# Patient Record
Sex: Male | Born: 1940 | Race: White | Hispanic: No | Smoking: Never smoker
Health system: Southern US, Community
[De-identification: ages and names within clinical notes are randomized; demographics above are authoritative.]

## PROBLEM LIST (undated history)

## (undated) ENCOUNTER — Emergency Department (HOSPITAL_COMMUNITY)
Discharge status: Against Medical Advice | Payer: Medicare Other | Attending: Emergency Medicine | Admitting: Emergency Medicine

## (undated) DIAGNOSIS — N400 Enlarged prostate without lower urinary tract symptoms: Secondary | ICD-10-CM

## (undated) DIAGNOSIS — Z87442 Personal history of urinary calculi: Secondary | ICD-10-CM

## (undated) DIAGNOSIS — F419 Anxiety disorder, unspecified: Secondary | ICD-10-CM

## (undated) DIAGNOSIS — I10 Essential (primary) hypertension: Secondary | ICD-10-CM

## (undated) DIAGNOSIS — E785 Hyperlipidemia, unspecified: Secondary | ICD-10-CM

## (undated) DIAGNOSIS — F0390 Unspecified dementia without behavioral disturbance: Secondary | ICD-10-CM

## (undated) HISTORY — PX: BACK SURGERY: SHX140

## (undated) HISTORY — DX: Hyperlipidemia, unspecified: E78.5

## (undated) HISTORY — PX: APPENDECTOMY: SHX54

## (undated) HISTORY — PX: TONSILLECTOMY: SHX5217

## (undated) HISTORY — DX: Essential (primary) hypertension: I10

## (undated) HISTORY — PX: CYSTOSCOPY W/ RETROGRADES: SHX1426

---

## 2001-02-13 ENCOUNTER — Encounter: Admission: RE | Admit: 2001-02-13 | Discharge: 2001-02-13 | Payer: Self-pay | Admitting: Sports Medicine

## 2001-12-07 ENCOUNTER — Ambulatory Visit (HOSPITAL_COMMUNITY): Admission: RE | Admit: 2001-12-07 | Discharge: 2001-12-07 | Payer: Self-pay | Admitting: Family Medicine

## 2001-12-07 ENCOUNTER — Encounter: Payer: Self-pay | Admitting: Family Medicine

## 2003-05-09 ENCOUNTER — Ambulatory Visit (HOSPITAL_COMMUNITY): Admission: RE | Admit: 2003-05-09 | Discharge: 2003-05-09 | Payer: Self-pay | Admitting: Internal Medicine

## 2005-03-05 ENCOUNTER — Ambulatory Visit: Payer: Self-pay | Admitting: Cardiology

## 2005-05-19 ENCOUNTER — Ambulatory Visit: Payer: Self-pay | Admitting: Cardiology

## 2005-07-27 ENCOUNTER — Ambulatory Visit: Payer: Self-pay | Admitting: Cardiology

## 2006-08-15 ENCOUNTER — Ambulatory Visit: Payer: Self-pay | Admitting: Cardiology

## 2007-08-01 ENCOUNTER — Ambulatory Visit: Payer: Self-pay | Admitting: Cardiology

## 2007-09-18 ENCOUNTER — Ambulatory Visit (HOSPITAL_COMMUNITY): Admission: RE | Admit: 2007-09-18 | Discharge: 2007-09-18 | Payer: Self-pay | Admitting: Urology

## 2007-10-03 ENCOUNTER — Encounter (HOSPITAL_COMMUNITY): Admission: RE | Admit: 2007-10-03 | Discharge: 2007-10-25 | Payer: Self-pay | Admitting: Orthopaedic Surgery

## 2007-10-09 ENCOUNTER — Ambulatory Visit (HOSPITAL_COMMUNITY): Admission: RE | Admit: 2007-10-09 | Discharge: 2007-10-09 | Payer: Self-pay | Admitting: Urology

## 2007-11-01 ENCOUNTER — Ambulatory Visit: Admission: RE | Admit: 2007-11-01 | Discharge: 2007-11-01 | Payer: Self-pay | Admitting: Orthopaedic Surgery

## 2007-12-28 ENCOUNTER — Ambulatory Visit (HOSPITAL_COMMUNITY): Admission: RE | Admit: 2007-12-28 | Discharge: 2007-12-28 | Payer: Self-pay | Admitting: Urology

## 2008-04-10 ENCOUNTER — Ambulatory Visit (HOSPITAL_BASED_OUTPATIENT_CLINIC_OR_DEPARTMENT_OTHER): Admission: RE | Admit: 2008-04-10 | Discharge: 2008-04-10 | Payer: Self-pay | Admitting: Urology

## 2008-05-29 ENCOUNTER — Encounter (HOSPITAL_COMMUNITY): Admission: RE | Admit: 2008-05-29 | Discharge: 2008-06-28 | Payer: Self-pay | Admitting: Orthopedic Surgery

## 2008-06-18 ENCOUNTER — Emergency Department (HOSPITAL_COMMUNITY): Admission: EM | Admit: 2008-06-18 | Discharge: 2008-06-18 | Payer: Self-pay | Admitting: Emergency Medicine

## 2008-07-03 ENCOUNTER — Encounter (HOSPITAL_COMMUNITY): Admission: RE | Admit: 2008-07-03 | Discharge: 2008-07-22 | Payer: Self-pay | Admitting: Orthopedic Surgery

## 2008-08-02 ENCOUNTER — Ambulatory Visit: Payer: Self-pay | Admitting: Cardiology

## 2009-02-12 ENCOUNTER — Ambulatory Visit (HOSPITAL_COMMUNITY): Admission: RE | Admit: 2009-02-12 | Discharge: 2009-02-12 | Payer: Self-pay | Admitting: Family Medicine

## 2009-02-26 ENCOUNTER — Ambulatory Visit (HOSPITAL_COMMUNITY): Admission: RE | Admit: 2009-02-26 | Discharge: 2009-02-26 | Payer: Self-pay | Admitting: Family Medicine

## 2009-06-05 ENCOUNTER — Encounter (INDEPENDENT_AMBULATORY_CARE_PROVIDER_SITE_OTHER): Payer: Self-pay | Admitting: *Deleted

## 2009-07-02 ENCOUNTER — Encounter (INDEPENDENT_AMBULATORY_CARE_PROVIDER_SITE_OTHER): Payer: Self-pay | Admitting: *Deleted

## 2009-07-21 ENCOUNTER — Ambulatory Visit (HOSPITAL_COMMUNITY): Admission: RE | Admit: 2009-07-21 | Discharge: 2009-07-21 | Payer: Self-pay | Admitting: Urology

## 2009-08-01 DIAGNOSIS — E785 Hyperlipidemia, unspecified: Secondary | ICD-10-CM | POA: Insufficient documentation

## 2009-08-01 DIAGNOSIS — I10 Essential (primary) hypertension: Secondary | ICD-10-CM | POA: Insufficient documentation

## 2009-08-13 ENCOUNTER — Encounter (INDEPENDENT_AMBULATORY_CARE_PROVIDER_SITE_OTHER): Payer: Self-pay | Admitting: *Deleted

## 2009-08-13 ENCOUNTER — Ambulatory Visit: Payer: Self-pay | Admitting: Cardiology

## 2009-08-14 ENCOUNTER — Encounter: Payer: Self-pay | Admitting: Cardiology

## 2009-08-22 ENCOUNTER — Encounter (INDEPENDENT_AMBULATORY_CARE_PROVIDER_SITE_OTHER): Payer: Self-pay | Admitting: *Deleted

## 2009-10-27 ENCOUNTER — Ambulatory Visit (HOSPITAL_COMMUNITY): Admission: RE | Admit: 2009-10-27 | Discharge: 2009-10-27 | Payer: Self-pay | Admitting: Urology

## 2010-01-01 ENCOUNTER — Encounter: Payer: Self-pay | Admitting: Cardiology

## 2010-08-13 ENCOUNTER — Ambulatory Visit: Payer: Self-pay | Admitting: Cardiology

## 2010-11-10 ENCOUNTER — Ambulatory Visit
Admission: RE | Admit: 2010-11-10 | Discharge: 2010-11-10 | Payer: Self-pay | Source: Home / Self Care | Attending: Urology | Admitting: Urology

## 2010-11-11 LAB — BASIC METABOLIC PANEL
BUN: 21 mg/dL (ref 6–23)
CO2: 29 mEq/L (ref 19–32)
Calcium: 9.5 mg/dL (ref 8.4–10.5)
Chloride: 103 mEq/L (ref 96–112)
Creatinine, Ser: 1.11 mg/dL (ref 0.4–1.5)
GFR calc Af Amer: >60 mL/min (ref >60)
GFR calc non Af Amer: >60 mL/min (ref >60)
Glucose, Bld: 107 mg/dL — ABNORMAL HIGH (ref 70–99)
Potassium: 3.9 mEq/L (ref 3.5–5.1)
Sodium: 138 mEq/L (ref 135–145)

## 2010-11-11 LAB — CBC
HCT: 37.9 % — ABNORMAL LOW (ref 39.0–52.0)
Hemoglobin: 13 g/dL (ref 13.0–17.0)
MCH: 32.3 pg (ref 26.0–34.0)
MCHC: 34.3 g/dL (ref 30.0–36.0)
MCV: 94.3 fL (ref 78.0–100.0)
Platelets: 190 10*3/uL (ref 150–400)
RBC: 4.02 MIL/uL — ABNORMAL LOW (ref 4.22–5.81)
RDW: 12.5 % (ref 11.5–15.5)
WBC: 6 10*3/uL (ref 4.0–10.5)

## 2010-11-19 ENCOUNTER — Ambulatory Visit (HOSPITAL_COMMUNITY)
Admission: RE | Admit: 2010-11-19 | Discharge: 2010-11-19 | Payer: Self-pay | Source: Home / Self Care | Attending: Urology | Admitting: Urology

## 2010-11-26 NOTE — Assessment & Plan Note (Signed)
Summary: f1y per check out/lg   Visit Type:  1 yr f/u Primary Provider:  John Giovanni  CC:  no cardiac complaints today.  History of Present Illness: Mr Miron times a day for further management and evaluation of his hyperlipidemia hypertension.  He's having no symptoms of angina or ischemia. He is extremely health-conscious and is very compliant. His weight is stable. He takes all his meds. His lipids have been a goal as has his blood pressure.  Current Medications (verified): 1)  Simvastatin 20 Mg Tabs (Simvastatin) .Marland Kitchen.. 1 Tab Once Daily 2)  Flomax 0.4 Mg Caps (Tamsulosin Hcl) .Marland Kitchen.. 1 Cap Once Daily 3)  Zyrtec Allergy 10 Mg Tabs (Cetirizine Hcl) .Marland Kitchen.. 1 Tab Once Daily 4)  Flonase 50 Mcg/act Susp (Fluticasone Propionate) .... Use As Directed 5)  Multivitamins   Tabs (Multiple Vitamin) .Marland Kitchen.. 1 Tab Once Daily 6)  Aspirin Ec 325 Mg Tbec (Aspirin) .... Take One Tablet By Mouth Daily 7)  Fish Oil 900 .... 2 Cap Once Daily 8)  Norvasc 2.5 Mg Tabs (Amlodipine Besylate) .Marland Kitchen.. 1 Tab Once Daily 9)  Proscar 5 Mg Tabs (Finasteride) .Marland Kitchen.. 1 Tab Once Daily 10)  Lisinopril 20 Mg Tabs (Lisinopril) .Marland Kitchen.. 1 Tab Once Daily 11)  K-Phos-Neutral [phone] Mg Tabs (K Phos Di & Mono-Sod Phos Mono) .Marland Kitchen.. 1 Tab Once Daily 12)  Hydroxyzine Hcl 10 Mg Tabs (Hydroxyzine Hcl) .... As Needed 13)  Triamcinolone Acetonide 0.025 % Crea (Triamcinolone Acetonide) .... As Needed  Allergies (verified): No Known Drug Allergies  Past History:  Past Medical History: Last updated: 2009/08/13 HYPERLIPIDEMIA (ICD-272.4) HYPERTENSION (ICD-401.9)    Past Surgical History: Last updated: 2009/08/13 Cystoscopy, retrograde pyelogram Screening colonoscopy (with biopsy) L5-L6 back surgery Appendectomy Tonsillectomy  Family History: Last updated: 2009-08-13 Father: deceased at 53 due to perforated intestine Mother: alive..has dementia Siblings: brother has diabetes.Marland Kitchenalive               sister deceased at 4 had  diabetes Family History of Coronary Artery Disease:  Family History of Diabetes:  Family History of Hypertension:   Social History: Last updated: 13-Aug-2009 Full Time  Married  Tobacco Use - No.  Alcohol Use - no Regular Exercise - yes  Risk Factors: Exercise: yes (Aug 13, 2009)  Risk Factors: Smoking Status: never (2009/08/13)  Review of Systems       negative other than history of present illness  Vital Signs:  Patient profile:   70 year old male Height:      70 inches Weight:      144 pounds BMI:     20.74 Pulse rate:   60 / minute Pulse rhythm:   irregular BP sitting:   138 / 66  (left arm) Cuff size:   large  Vitals Entered By: Danielle Rankin, CMA (August 13, 2010 9:30 AM)  Physical Exam  General:  Well developed, well nourished, in no acute distress. Head:  normocephalic and atraumatic Eyes:  PERRLA/EOM intact; conjunctiva and lids normal. Neck:  Neck supple, no JVD. No masses, thyromegaly or abnormal cervical nodes. Chest Wall:  no deformities or breast masses noted Lungs:  Clear bilaterally to auscultation and percussion. Heart:  PMI not displaced, normal S1-S2, no murmur, regular rate and rhythm, carotid upstrokes equal bilaterally without bruits Abdomen:  soft, positive bowel sounds, no bruits Msk:  Back normal, normal gait. Muscle strength and tone normal. Pulses:  pulses normal in all 4 extremities Extremities:  No clubbing or cyanosis. Neurologic:  Alert and oriented x 3. Skin:  Intact  without lesions or rashes. Psych:  Normal affect.   Impression & Recommendations:  Problem # 1:  HYPERLIPIDEMIA (ICD-272.4) Assessment Improved  His updated medication list for this problem includes:    Simvastatin 20 Mg Tabs (Simvastatin) .Marland Kitchen... 1 tab once daily  Problem # 2:  HYPERTENSION (ICD-401.9) Assessment: Improved  The following medications were removed from the medication list:    Hydrochlorothiazide 12.5 Mg Caps (Hydrochlorothiazide) .Marland Kitchen... Pt  d/c'd His updated medication list for this problem includes:    Aspirin Ec 325 Mg Tbec (Aspirin) .Marland Kitchen... Take one tablet by mouth daily    Norvasc 2.5 Mg Tabs (Amlodipine besylate) .Marland Kitchen... 1 tab once daily    Lisinopril 20 Mg Tabs (Lisinopril) .Marland Kitchen... 1 tab once daily  Clinical Reports Reviewed:  CXR:  02/12/2009: CXR Results:   Clinical Data: 70 year old male with exposure brain.  Nonsmoker.    CHEST - 2 VIEW    Comparison: CT abdomen 06/18/2008.    Findings: Subcentimeter round densities about the left hilum and at   the right lung base are thought to reflect vessels on end.  No   corresponding granulomas are seen on the comparison abdomen CT.   Cardiac size and mediastinal contours are within normal limits.   Visualized tracheal air column is within normal limits.   Postoperative changes to the distal right clavicle.  No   pneumothorax, pulmonary edema, pleural effusion, consolidation,   confluent airspace opacity, or pulmonary mass identified. No acute   osseous abnormality identified.    IMPRESSION:   No acute cardiopulmonary abnormality.    Read By:  Augusto Gamble,  M.D.   Released By:  Augusto Gamble,  M.D.  Nuclear Study:  05/19/2005:  Assessment: 1. Exaggerated systolic blood pressure response to exercise. 2. Excellent exercise tolerance.  Artavious C. Wall. MD, [facility]  03/20/2004:  Impression: Stress Cardiolite clinically and electrically negative for ischemia. Cardiolite scan with probable normal perfusion and mild soft-tissue attenuation. Note scan is unchanged from still frames from 2002. Left ventricular function on gating calculated at 62%.  Pricilla Riffle, MD, [facility]   Patient Instructions: 1)  Your physician recommends that you schedule a follow-up appointment in: 1 year

## 2010-11-26 NOTE — Op Note (Signed)
NAMESHEFFIELD, HAWKER                 ACCOUNT NO.:  [id number]  MEDICAL RECORD NO.:  [id number]          PATIENT TYPE:  AMB  LOCATION:  DAY                          FACILITY:  [facility]  PHYSICIAN:  Courtney Paris, M.D.DATE OF BIRTH:  [birth year]  DATE OF PROCEDURE:  11/10/2010 DATE OF DISCHARGE:                              OPERATIVE REPORT   PREOPERATIVE DIAGNOSES:  Left distal ureteral stone, right renal stones with horseshoe kidney.  POSTOPERATIVE DIAGNOSES:  No left ureteral stone, right renal stones with horseshoe kidney.  PROCEDURES:  Cystoscopy, bilateral retrogrades with fluoroscopy, left ureteroscopy, right ureteral stent placement.  ANESTHESIA:  General.  SURGEON:  Courtney Paris, M.D.  BRIEF HISTORY:  A 70 year old patient who presented with acute left flank pain on October 07, 2010, and a CT scan showed a left distal ureteral stone that was 8 to 9 mm.  He has had rare but intermittent pain on the left side since then.  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  He had previous right lithotripsy in 2008, September 2010 and July 2011.  He enters to have the stone on the left removed by ureteroscopy, possible holmium laser treatment and placement of a stent on the right with the horseshoe kidney prior to anticipated lithotripsy in about 7 to 10 days.  PROCEDURE IN DETAIL:  The patient was placed on the operating table in the dorsal lithotomy position.  After satisfactory induction of general anesthesia he was prepped and draped with Betadine in the usual sterile fashion.  He was given IV Cipro.  Time-out was then performed and the patient and the procedure were then reconfirmed.  A 21-panendoscope was passed down the anterior urethra.  No strictures were seen.  The posterior urethra was mildly enlarged with his prostate and the bladder was entered.  The right orifice was  easy to see.  The left was a little bit higher placed on the left lateral wall but the bladder was free of any mucosal lesions.  A 6-open-ended ureteral catheter was inserted and the retrograde demonstrated no hydronephrosis but it looked like there was a stone in the left distal ureter.  I passed a guidewire up the left ureteral catheter under fluoroscopy and removed the open-ended catheter. A 6-short ureteroscope was then passed up the ureter but the stone was not seen.  Further view showed that this was probably a phlebolith that was directly posterior to the ureter and was not within the ureter proper.  The scope and guidewire were then removed.  The right orifice was catheterized with the 6 open-ended ureteral catheter and a retrograde demonstrated the horseshoe kidney with the stones in the renal pelvis.  A guidewire was then passed up to the level of the renal pelvis on the right and the open-ended catheter removed. Over the guidewire a 6-French x 24 cm length double-J ureteral stent was then passed and the guidewire was removed with a nice coil in the renal pelvis and one in the bladder.  The bladder was drained and the scope  removed.  The patient was taken to the recovery room in good condition to be later treated with lithotripsy on the right in 9 days.     Courtney Paris, M.D.     HMK/MEDQ  D:  11/10/2010  T:  11/10/2010  Job:  [id number]  Electronically Signed by Vic Blackbird M.D. on 11/26/2010 01:24:48 PM

## 2011-01-10 LAB — BASIC METABOLIC PANEL
BUN: 17 mg/dL (ref 6–23)
CO2: 29 mEq/L (ref 19–32)
Calcium: 9.2 mg/dL (ref 8.4–10.5)
Chloride: 106 mEq/L (ref 96–112)
Creatinine, Ser: 1.01 mg/dL (ref 0.4–1.5)
GFR calc Af Amer: >60 mL/min (ref >60)
GFR calc non Af Amer: >60 mL/min (ref >60)
Glucose, Bld: 102 mg/dL — ABNORMAL HIGH (ref 70–99)
Potassium: 4.3 mEq/L (ref 3.5–5.1)
Sodium: 139 mEq/L (ref 135–145)

## 2011-01-10 LAB — CBC
HCT: 39.3 % (ref 39.0–52.0)
Hemoglobin: 13.2 g/dL (ref 13.0–17.0)
MCHC: 33.5 g/dL (ref 30.0–36.0)
MCV: 96 fL (ref 78.0–100.0)
Platelets: 182 10*3/uL (ref 150–400)
RBC: 4.09 MIL/uL — ABNORMAL LOW (ref 4.22–5.81)
RDW: 12.9 % (ref 11.5–15.5)
WBC: 5 10*3/uL (ref 4.0–10.5)

## 2011-01-11 ENCOUNTER — Other Ambulatory Visit (HOSPITAL_COMMUNITY): Payer: Self-pay | Admitting: Family Medicine

## 2011-01-11 DIAGNOSIS — Z139 Encounter for screening, unspecified: Secondary | ICD-10-CM

## 2011-01-13 ENCOUNTER — Ambulatory Visit (HOSPITAL_COMMUNITY)
Admission: RE | Admit: 2011-01-13 | Discharge: 2011-01-13 | Disposition: A | Discharge status: Home / Self Care | Payer: Medicare Other | Source: Ambulatory Visit | Attending: Family Medicine | Admitting: Family Medicine

## 2011-01-13 DIAGNOSIS — Z139 Encounter for screening, unspecified: Secondary | ICD-10-CM

## 2011-01-13 DIAGNOSIS — M899 Disorder of bone, unspecified: Secondary | ICD-10-CM | POA: Insufficient documentation

## 2011-01-29 LAB — CBC
HCT: 40.4 % (ref 39.0–52.0)
Hemoglobin: 14 g/dL (ref 13.0–17.0)
MCHC: 34.6 g/dL (ref 30.0–36.0)
MCV: 96.2 fL (ref 78.0–100.0)
Platelets: 201 10*3/uL (ref 150–400)
RBC: 4.2 MIL/uL — ABNORMAL LOW (ref 4.22–5.81)
RDW: 13.1 % (ref 11.5–15.5)
WBC: 5.9 10*3/uL (ref 4.0–10.5)

## 2011-01-29 LAB — BASIC METABOLIC PANEL
BUN: 16 mg/dL (ref 6–23)
CO2: 29 mEq/L (ref 19–32)
Calcium: 9.5 mg/dL (ref 8.4–10.5)
Chloride: 106 mEq/L (ref 96–112)
Creatinine, Ser: 1.13 mg/dL (ref 0.4–1.5)
GFR calc Af Amer: >60 mL/min (ref >60)
GFR calc non Af Amer: >60 mL/min (ref >60)
Glucose, Bld: 98 mg/dL (ref 70–99)
Potassium: 5.1 mEq/L (ref 3.5–5.1)
Sodium: 140 mEq/L (ref 135–145)

## 2011-03-09 NOTE — Assessment & Plan Note (Signed)
[facility]                            CARDIOLOGY OFFICE NOTE   Jacob Fleming, Jacob Fleming                        MRN:          [id number]  DATE:08/01/2007                            DOB:          1941/01/22    The patient returns to the office today.   PROBLEM LIST:  1. Hypertension which has been well controlled.  2. Hyperlipidemia.  Lipids were at goal when Dr. Sudie Bailey checked them      on December 30, 2006.   CURRENT MEDICATIONS:  1. Zocor 20 mg a day.  2. Aspirin 325 mg a day.  3. Hydrochlorothiazide 12.5 mg a day.  4. Omega-3.  5. Norvasc 2.5 mg a day.   [place]  10 days.  He had no symptoms of angina, ischemia, or otherwise.   PHYSICAL EXAMINATION:  VITAL SIGNS:  Blood pressure 132/70, pulse 60 and  regular, weight 153.  GENERAL:  He looks 10 years younger than his stated age.  He is alert  and oriented x3.  SKIN:  Warm and dry.  NECK:  Supple.  Carotid upstrokes are equal bilaterally without bruits.  No JVD.  Thyroid is not enlarged.  Trachea is midline.  LUNGS:  Clear.  HEART:  Nondisplaced PMI and normal S1 and S2.  ABDOMEN:  Soft with good bowel sounds.  EXTREMITIES:  No edema.  Pulses are brisk.  NEUROLOGY:  Intact.  MUSCULOSKELETAL:  Unremarkable.  SKIN:  Normal.   EKG shows sinus rhythm with a mild left axis deviation with poor R wave  progression in the anterior precordial leads.  This is unchanged.   LABORATORY DATA:  I reviewed the laboratory data from Dr. Michelle Nasuti  office with the patient.  Specifically his hemoglobin A1C was 6.0,  lipids are at goal, hemoglobin 12.8.  He looked a little bit volume  depleted with a BUN of 25, creatinine 1.15.  PSA was normal at 0.17.   PLAN:  The patient is doing well.  I have no changes in his program.  I  have advised him to drink more free water.  I will see him back in a  year.     Andrew C. Daleen Squibb, MD, [facility]  Electronically Signed    TCW/MedQ  DD: 08/01/2007  DT: 08/02/2007  Job #: [id number]   cc:   Mila Homer. Sudie Bailey, M.D.

## 2011-03-09 NOTE — Op Note (Signed)
Jacob Fleming, Jacob Fleming                 ACCOUNT NO.:  [id number]   MEDICAL RECORD NO.:  [id number]          PATIENT TYPE:  AMB   LOCATION:  [facility]                         FACILITY:  [facility]   PHYSICIAN:  Courtney Paris, M.D.DATE OF BIRTH:  [birth year]   DATE OF PROCEDURE:  04/10/2008  DATE OF DISCHARGE:                               OPERATIVE REPORT   PREOPERATIVE DIAGNOSIS:  Right ureteropelvic junction stone, horseshoe  kidney.   POSTOPERATIVE DIAGNOSIS:  Right ureteropelvic junction stone, horseshoe  kidney.   OPERATIONS:  Cystoscopy, retrograde pyelogram, flexible ureteroscopy  with holmium lase tripsy and insertion of right ureteral stent.   ANESTHESIA:  General.   SURGEON:  Courtney Paris, M.D.   BRIEF HISTORY:  This 70 year old patient was admitted for a persistent  10-12 mm stone at the proximal right ureter of a horseshoe kidney.  This  has been refractory to lithotripsy x2 both in November 2008 and March  2009.  He enters now for ureteroscopy and lasertripsy at this time.  He  is having increasing pain and he does have hydronephrosis on the right  side.   The patient was placed on the operating table in dorsal lithotomy  position.  After satisfactory induction of general anesthesia, he was  prepped and draped with the usual sterile fashion.  Time-out was then  performed and the patient and the procedure were then reconfirmed.  He  was getting Cipro IV.  The panendoscope was passed down the anterior  urethra.  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  The  posterior urethra with mildly enlarged prostate.  The bladder was  entered, carefully inspected.  No lesions seen.  The right orifice was  visualized and a 5 open-ended ureteral catheter was then inserted.   An occlusive retrograde demonstrated the stone in the proximal right  ureter and what appeared to be a UPJ obstruction as it  took a lot of  pressure to get the dye to go past the stone into the dilated collecting  system.   A guidewire was then passed beyond the stone into the kidney.  The scope  was then removed and over the guidewire a short ureteral access sheath  was then passed up to the level of the stone and then again with the  ureteral access sheath over the outside.  When this was done and the  ureteral access sheath had been inserted up to the level of the stone,  the inner cannula and the guidewire were then removed.  Using the [employer] flexible ureteroscope, I passed this up the access sheath to the  stone, took some pictures of the original stone impacted at the UV  junction and then used the holmium laser at 0.5 joules under direct  vision and broke the stone into pieces.  There were some pieces that  were very adherent to the mucosa and it took quite a bit of manipulation  to tease these off the mucosa.  Finally all the stone was completely  removed.  This was confirmed by x-ray and also visually.  A small  fragment was removed,the rest of them flushed out into the bladder  through the scope.  When I finished after about nearly an hour and a  half of working, the scope would not come out through the access sheath  so I had to remove the access sheath with the scope intact.  I then  passed the cystoscope again and a guidewire was then passed into the  orifice.  There was a little bit of blood coming from the right orifice  but the guidewire passed up into the region of the kidney without  difficulty and a 6-French x 24 cm length double-J ureteral stent was  then passed.  The guidewire was  removed with a nice coil in the renal pelvis and one in the bladder.  Bladder was drained, B and O suppository inserted.  The patient given  Toradol IV.  He was taken to the recovery room in good condition and  will be later discharged as an outpatient.  We will leave the stent  probably for 10 to 12  days to allow healing of the UP junction.      Courtney Paris, M.D.  Electronically Signed     HMK/MEDQ  D:  04/10/2008  T:  04/10/2008  Job:  [id number]
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
## 2011-03-12 NOTE — Op Note (Signed)
NAME:  DOCTOR, SHEAHAN                           ACCOUNT NO.:  [id number]   MEDICAL RECORD NO.:  [id number]                   PATIENT TYPE:  AMB   LOCATION:  DAY                                  FACILITY:  [facility]   PHYSICIAN:  R. Roetta Sessions, M.D.              DATE OF BIRTH:  1940/11/29   DATE OF PROCEDURE:  05/09/2003  DATE OF DISCHARGE:                                  PROCEDURE NOTE   PROCEDURE:  Screening colonoscopy (with biopsy).   ENDOSCOPIST:  Gerrit Friends. Rourk, M.D.   INDICATIONS FOR PROCEDURE:  The patient is a 70 year old gentleman referred  for colorectal cancer screening at the courtesy of Dr. John Giovanni.  He  has a history of having a sigmoidoscopy previously.  He has never had his  entire lower GI tract imaged.  No family history of colorectal neoplasia.  He has reported no GI tract symptoms.  Colonoscopy is now being done as a  standard screening maneuver.  This approach has been discussed with the  patient, at length, at the bedside. The potential risks, benefits, and  alternatives have been reviewed; questions answered; he is agreeable.  Please see my handwritten H&P for more information.   PROCEDURE NOTE:  O2 saturation, blood pressure, pulse and respirations were  monitored throughout the entire procedure.  Conscious sedation: Versed 3 mg IV, Demerol 75 mg IV in divided doses.   INSTRUMENT:  Olympus video chip pediatric colonoscope.   FINDINGS:  A digital rectal exam revealed no abnormalities.   ENDOSCOPIC FINDINGS:  The prep was excellent.   RECTUM:  Examination of the rectal mucosa including the retroflex view of  the anal verge revealed no abnormalities.   COLON:  The colonic mucosa was surveyed from the rectosigmoid junction  through the left transverse and right colon to the area of the appendiceal  orifice, ileocecal valve, and cecum.  These structures were well seen and  photographed for the record.  The patient was noted to have a 4 mm  sessile  polyp in the mid right colon.  Otherwise, the colonic mucosa all the way to  the cecum appeared normal.   From the level of the cecum and ileocecal valve the scope was slowly  withdrawn. All previously mentioned mucosal surfaces were again seen; and no  abnormalities were observed.  The patient tolerated the procedure well and  was reacted in endoscopy.   IMPRESSION:  1. Normal rectum.  2. Diminutive polyp in the right colon, as described above cold     biopsied/removed.  The remainder of the colonic mucosa appeared normal.   RECOMMENDATIONS:  1. Follow up on path.  2. Further recommendations to follow.  Jonathon Bellows, M.D.    RMR/MEDQ  D:  05/09/2003  T:  05/09/2003  Job:  [id number]   cc:   Mila Homer. Sudie Bailey, M.D.  [address] [place], [place] [address]  Fax: [phone]

## 2011-03-12 NOTE — Assessment & Plan Note (Signed)
[facility]                              CARDIOLOGY OFFICE NOTE   JARRYD, GRATZ                        MRN:          [id number]  DATE:08/15/2006                            DOB:          Oct 08, 1941    Mr. Bondy returns today for further management of the following issues:  1. Hypertension.  2. Hyperlipidemia.   He continues to do remarkably well. He is a picture of health and takes  great care of himself. He rides bikes on a regular basis and is having no  complaints.   MEDICATIONS:  His medications are:  1. Zocor 20 mg a day.  2. Flomax 0.4 q day.  3. Zyrtec 10 mg a day.  4. Flonase 50 mcg q day.  5. Multivitamin q day.  6. Aspirin 325 a day.  7. HCTZ 12.5 q day.  8. Omega-3 q day.  9. Norvasc 2.5 q day.   His blood pressure today is 122/74. Pulse: 58 and regular. Weight: 158, down  6 pounds. He looks remarkably younger than 84.  HEENT: Normocephalic, atraumatic. PERRLA. Extra-ocular movements intact.  Sclera clear. Facial symmetry is normal. Dentition is satisfactory. Neck. No  JVD. Carotid upstrokes are equal bilaterally without bruits. No thyromegaly.  LUNGS:  Clear.  HEART: Reveals a regular rate and rhythm.  There is no gallop or rub.  ABDOMEN: Soft with good bowel sounds. There is no bruit. No tenderness.  EXTREMITIES: Reveals no cyanosis, clubbing, or edema. Pulses are brisk.   EKG shows sinus brady with a slight left anterior fascicular block, which is  unchanged.   I am delighted with how Mr. Pracht is doing. I have made no changes in his  program. Will plan on seeing him back in a year.     Xavian C. Daleen Squibb, MD, [facility]    TCW/MedQ  DD: 08/15/2006  DT: 08/15/2006  Job #: [id number]   cc:   Mila Homer. Sudie Bailey, M.D.

## 2011-07-19 LAB — BASIC METABOLIC PANEL
BUN: 14
CO2: 28
Calcium: 9.1
Chloride: 100
Creatinine, Ser: 1.07
GFR calc Af Amer: >60
GFR calc non Af Amer: >60
Glucose, Bld: 95
Potassium: 4.4
Sodium: 139

## 2011-07-19 LAB — URINALYSIS, ROUTINE W REFLEX MICROSCOPIC
Bilirubin Urine: NEGATIVE
Glucose, UA: NEGATIVE
Hgb urine dipstick: NEGATIVE
Ketones, ur: NEGATIVE
Nitrite: NEGATIVE
Protein, ur: NEGATIVE
Specific Gravity, Urine: 1.015
Urobilinogen, UA: 0.2
pH: 6

## 2011-07-19 LAB — CBC
HCT: 39.3
Hemoglobin: 13.4
MCHC: 34.1
MCV: 94.9
Platelets: 217
RBC: 4.14 — ABNORMAL LOW
RDW: 12.4
WBC: 4.9

## 2011-07-19 LAB — URINE MICROSCOPIC-ADD ON

## 2011-07-22 LAB — URINALYSIS, ROUTINE W REFLEX MICROSCOPIC
Bilirubin Urine: NEGATIVE
Hgb urine dipstick: NEGATIVE
Ketones, ur: NEGATIVE
Specific Gravity, Urine: 1.017
pH: 7

## 2011-07-22 LAB — BASIC METABOLIC PANEL
CO2: 29
Chloride: 100
GFR calc non Af Amer: >60
Glucose, Bld: 96
Potassium: 4.1
Sodium: 135

## 2011-07-22 LAB — CBC
HCT: 40.4
Hemoglobin: 13.7
MCHC: 33.9
MCV: 95.7
RDW: 13

## 2011-07-30 LAB — BASIC METABOLIC PANEL
BUN: 23
Creatinine, Ser: 1
GFR calc non Af Amer: >60
Glucose, Bld: 90
Potassium: 4

## 2011-07-30 LAB — URINALYSIS, ROUTINE W REFLEX MICROSCOPIC
Bilirubin Urine: NEGATIVE
Ketones, ur: NEGATIVE
Nitrite: NEGATIVE
Specific Gravity, Urine: 1.017
Urobilinogen, UA: 0.2

## 2011-07-30 LAB — CBC
HCT: 38.4 — ABNORMAL LOW
Platelets: 227
RDW: 12.8

## 2011-07-30 LAB — URINE MICROSCOPIC-ADD ON

## 2011-08-03 LAB — URINE MICROSCOPIC-ADD ON

## 2011-08-03 LAB — CBC
HCT: 42
MCV: 93.8
RBC: 4.48
WBC: 6.4

## 2011-08-03 LAB — BASIC METABOLIC PANEL
Chloride: 104
GFR calc Af Amer: >60
Potassium: 4.3
Sodium: 140

## 2011-08-03 LAB — URINALYSIS, ROUTINE W REFLEX MICROSCOPIC
Glucose, UA: NEGATIVE
Hgb urine dipstick: NEGATIVE
Specific Gravity, Urine: 1.021

## 2011-08-12 ENCOUNTER — Encounter: Payer: Self-pay | Admitting: Cardiology

## 2011-08-16 ENCOUNTER — Ambulatory Visit (INDEPENDENT_AMBULATORY_CARE_PROVIDER_SITE_OTHER): Payer: Medicare Other | Admitting: Cardiology

## 2011-08-16 ENCOUNTER — Encounter: Payer: Self-pay | Admitting: Cardiology

## 2011-08-16 VITALS — BP 106/58 | HR 69 | Ht 70.0 in | Wt 140.0 lb

## 2011-08-16 DIAGNOSIS — E785 Hyperlipidemia, unspecified: Secondary | ICD-10-CM

## 2011-08-16 DIAGNOSIS — I10 Essential (primary) hypertension: Secondary | ICD-10-CM

## 2011-08-16 NOTE — Patient Instructions (Signed)
Your physician wants you to follow-up in: 1 year with Dr. Wall. You will receive a reminder letter in the mail two months in advance. If you don't receive a letter, please call our office to schedule the follow-up appointment.  

## 2011-08-16 NOTE — Progress Notes (Signed)
HPI Jacob Fleming comes in today for their wish for management of his cardiac risk factors including hypertension and hyperlipidemia. His wife is terminally ill  Which is been very stressful for him. He is not getting any exercise.  He denies any angina or ischemic symptoms. The symptoms of TIAs or mini strokes.  Medications reviewed and he is compliant.  Laboratory data from his primary care shows remarkably good lipids goal. I reviewed those with him today.  His EKG shows normal sinus rhythm with left axis deviation no changes. Past Medical History  Diagnosis Date  . Hyperlipidemia   . Hypertension     Past Surgical History  Procedure Date  . Tonsillectomy   . Appendectomy   . Back surgery     L5-L6  . Cystoscopy w/ retrogrades     pyelogram    Family History  Problem Relation Age of Onset  . Diabetes Brother   . Diabetes Sister   . Dementia Mother   . Other Father     perforated intestine  . Coronary artery disease    . Diabetes    . Hypertension      History   Social History  . Marital Status: Married    Spouse Name: N/A    Number of Children: N/A  . Years of Education: N/A   Occupational History  .      full time   Social History Main Topics  . Smoking status: Never Smoker   . Smokeless tobacco: Never Used  . Alcohol Use: No  . Drug Use: No  . Sexually Active: Not on file   Other Topics Concern  . Not on file   Social History Narrative  . No narrative on file    No Known Allergies  Current Outpatient Prescriptions  Medication Sig Dispense Refill  . amLODipine (NORVASC) 2.5 MG tablet Take 2.5 mg by mouth daily.        Marland Kitchen aspirin 325 MG tablet Take 325 mg by mouth daily.        . cetirizine (ZYRTEC) 10 MG tablet Take 10 mg by mouth daily as needed.        . finasteride (PROSCAR) 5 MG tablet Take 5 mg by mouth daily.        Marland Kitchen lisinopril (PRINIVIL,ZESTRIL) 20 MG tablet Take 20 mg by mouth daily.        . multivitamin (THERAGRAN) per tablet Take 1  tablet by mouth daily.        . Omega-3 Fatty Acids (RA FISH OIL) 900 MG CAPS Take 2 tablets by mouth daily.        . phosphorus (K PHOS NEUTRAL) 155-852-130 MG tablet Take 1 tablet by mouth daily.        . simvastatin (ZOCOR) 20 MG tablet Take 20 mg by mouth at bedtime.        . Tamsulosin HCl (FLOMAX) 0.4 MG CAPS Take 0.4 mg by mouth daily.        Marland Kitchen triamcinolone (KENALOG) 0.025 % cream Apply topically as needed.          ROS Negative other than HPI.   PE General Appearance: well developed, well nourished in no acute distress HEENT: symmetrical face, PERRLA, good dentition  Neck: no JVD, thyromegaly, or adenopathy, trachea midline Chest: symmetric without deformity Cardiac: PMI non-displaced, RRR, normal S1, S2, no gallop or murmur Lung: clear to ausculation and percussion Vascular: all pulses full without bruits  Abdominal: nondistended, nontender, good bowel sounds, no HSM,  no bruits Extremities: no cyanosis, clubbing or edema, no sign of DVT, no varicosities  Skin: normal color, no rashes Neuro: alert and oriented x 3, non-focal Pysch: normal affect Filed Vitals:   08/16/11 0939  BP: 106/58  Pulse: 69  Height: 5\' 10"  (1.778 m)  Weight: 140 lb (63.504 kg)    EKG  Labs and Studies Reviewed.   Lab Results  Component Value Date   WBC 6.0 11/10/2010   HGB 13.0 11/10/2010   HCT 37.9* 11/10/2010   MCV 94.3 11/10/2010   PLT 190 11/10/2010      Chemistry      Component Value Date/Time   NA 138 11/10/2010 1023   K 3.9 11/10/2010 1023   CL 103 11/10/2010 1023   CO2 29 11/10/2010 1023   BUN 21 11/10/2010 1023   CREATININE 1.11 11/10/2010 1023      Component Value Date/Time   CALCIUM 9.5 11/10/2010 1023       No results found for this basename: CHOL   No results found for this basename: HDL   No results found for this basename: LDLCALC   No results found for this basename: TRIG   No results found for this basename: CHOLHDL   No results found for this basename:  HGBA1C   No results found for this basename: ALT, AST, GGT, ALKPHOS, BILITOT   No results found for this basename: TSH

## 2011-08-16 NOTE — Assessment & Plan Note (Signed)
Under good control, no changes in treatment.

## 2011-08-16 NOTE — Assessment & Plan Note (Signed)
Under good control, no change in treatment.

## 2011-09-22 ENCOUNTER — Encounter: Payer: Self-pay | Admitting: Cardiology

## 2012-08-01 ENCOUNTER — Ambulatory Visit: Payer: Medicare Other | Admitting: Cardiology

## 2012-08-23 ENCOUNTER — Encounter: Payer: Self-pay | Admitting: Cardiology

## 2012-08-23 ENCOUNTER — Ambulatory Visit (INDEPENDENT_AMBULATORY_CARE_PROVIDER_SITE_OTHER): Payer: Medicare Other | Admitting: Cardiology

## 2012-08-23 VITALS — BP 110/58 | HR 54 | Ht 70.0 in | Wt 142.0 lb

## 2012-08-23 DIAGNOSIS — E785 Hyperlipidemia, unspecified: Secondary | ICD-10-CM

## 2012-08-23 DIAGNOSIS — I10 Essential (primary) hypertension: Secondary | ICD-10-CM

## 2012-08-23 NOTE — Assessment & Plan Note (Signed)
Outside labs reviewed and are at goal. Reviewed with patient. LFTs are normal. Return to see me in one year or when necessary.

## 2012-08-23 NOTE — Assessment & Plan Note (Signed)
Under good control. Continue current medications and exercise.

## 2012-08-23 NOTE — Progress Notes (Signed)
HPI Jacob Fleming returns today for evaluation and management of his cardiac risk factors. He is doing remarkably well and continues exercise on regular basis. He is having no angina or symptoms of coronary ischemia. He denies claudication or symptoms of TIAs. Recent blood work reviewed from his primary care shows his lipids to be remarkably good with total cholesterol HDL ratio of 3. His hemoglobin A1c is 5.9 but is fasting blood sugar was normal.  Past Medical History  Diagnosis Date  . Hyperlipidemia   . Hypertension     Current Outpatient Prescriptions  Medication Sig Dispense Refill  . amLODipine (NORVASC) 2.5 MG tablet Take 2.5 mg by mouth daily.        Marland Kitchen aspirin 325 MG tablet Take 325 mg by mouth daily.        Marland Kitchen b complex vitamins tablet Take 1 tablet by mouth daily.      Marland Kitchen BIOTIN PO Take by mouth daily.      . cetirizine (ZYRTEC) 10 MG tablet Take 10 mg by mouth daily as needed.        . chlorthalidone (HYGROTON) 25 MG tablet Take 25 mg by mouth daily.      . Cholecalciferol (VITAMIN D3) 1000 UNITS CAPS Take by mouth daily.      . finasteride (PROSCAR) 5 MG tablet Take 5 mg by mouth daily.        Marland Kitchen lisinopril (PRINIVIL,ZESTRIL) 20 MG tablet Take 20 mg by mouth daily.        Marland Kitchen LORazepam (ATIVAN) 0.5 MG tablet Take 0.5 mg by mouth as needed.      . multivitamin (THERAGRAN) per tablet Take 1 tablet by mouth daily.        . Omega-3 Fatty Acids (RA FISH OIL) 900 MG CAPS Take 2 tablets by mouth daily.        . phosphorus (K PHOS NEUTRAL) 155-852-130 MG tablet Take 1 tablet by mouth daily.        . potassium chloride (K-DUR) 10 MEQ tablet Take 10 mEq by mouth daily.      Marland Kitchen pyridOXINE (VITAMIN B-6) 100 MG tablet Take 100 mg by mouth daily.      . simvastatin (ZOCOR) 20 MG tablet Take 20 mg by mouth at bedtime.        . Tamsulosin HCl (FLOMAX) 0.4 MG CAPS Take 0.4 mg by mouth daily.        Marland Kitchen triamcinolone (KENALOG) 0.025 % cream Apply topically as needed.          No Known Allergies  Family  History  Problem Relation Age of Onset  . Diabetes Brother   . Diabetes Sister   . Dementia Mother   . Other Father     perforated intestine  . Coronary artery disease    . Diabetes    . Hypertension      History   Social History  . Marital Status: Married    Spouse Name: N/A    Number of Children: N/A  . Years of Education: N/A   Occupational History  .      full time   Social History Main Topics  . Smoking status: Never Smoker   . Smokeless tobacco: Never Used  . Alcohol Use: No  . Drug Use: No  . Sexually Active: Not on file   Other Topics Concern  . Not on file   Social History Narrative  . No narrative on file    ROS ALL NEGATIVE EXCEPT THOSE  NOTED IN HPI  PE  General Appearance: well developed, well nourished in no acute distress HEENT: symmetrical face, PERRLA, good dentition  Neck: no JVD, thyromegaly, or adenopathy, trachea midline Chest: symmetric without deformity Cardiac: PMI non-displaced, RRR, normal S1, S2, no gallop or murmur Lung: clear to ausculation and percussion Vascular: all pulses full without bruits  Abdominal: nondistended, nontender, good bowel sounds, no HSM, no bruits Extremities: no cyanosis, clubbing or edema, no sign of DVT, no varicosities  Skin: normal color, no rashes Neuro: alert and oriented x 3, non-focal Pysch: normal affect  EKG Sinus bradycardia, left axis deviation, no acute changes. BMET    Component Value Date/Time   NA 138 11/10/2010 1023   K 3.9 11/10/2010 1023   CL 103 11/10/2010 1023   CO2 29 11/10/2010 1023   GLUCOSE 107* 11/10/2010 1023   BUN 21 11/10/2010 1023   CREATININE 1.11 11/10/2010 1023   CALCIUM 9.5 11/10/2010 1023   GFRNONAA >60 11/10/2010 1023   GFRAA  Value: >60        The eGFR has been calculated using the MDRD equation. This calculation has not been validated in all clinical situations. eGFR's persistently <60 mL/min signify possible Chronic Kidney Disease. 11/10/2010 1023    Lipid Panel  No  results found for this basename: chol, trig, hdl, cholhdl, vldl, ldlcalc    CBC    Component Value Date/Time   WBC 6.0 11/10/2010 1023   RBC 4.02* 11/10/2010 1023   HGB 13.0 11/10/2010 1023   HCT 37.9* 11/10/2010 1023   PLT 190 11/10/2010 1023   MCV 94.3 11/10/2010 1023   MCH 32.3 11/10/2010 1023   MCHC 34.3 11/10/2010 1023   RDW 12.5 11/10/2010 1023

## 2012-08-23 NOTE — Patient Instructions (Signed)
Your physician recommends that you continue on your current medications as directed. Please refer to the Current Medication list given to you today.   Your physician wants you to follow-up in: 1 year with Dr. Wall. You will receive a reminder letter in the mail two months in advance. If you don't receive a letter, please call our office to schedule the follow-up appointment.  

## 2013-05-08 ENCOUNTER — Emergency Department (HOSPITAL_COMMUNITY)
Admission: EM | Admit: 2013-05-08 | Discharge: 2013-05-08 | Disposition: A | Discharge status: Home / Self Care | Payer: Medicare Other | Attending: Emergency Medicine | Admitting: Emergency Medicine

## 2013-05-08 ENCOUNTER — Encounter (HOSPITAL_COMMUNITY): Payer: Self-pay

## 2013-05-08 ENCOUNTER — Emergency Department (HOSPITAL_COMMUNITY): Payer: Medicare Other

## 2013-05-08 ENCOUNTER — Encounter (HOSPITAL_COMMUNITY)
Admission: RE | Admit: 2013-05-08 | Discharge: 2013-05-08 | Disposition: A | Discharge status: Home / Self Care | Payer: Medicare Other | Source: Ambulatory Visit | Attending: Urology | Admitting: Urology

## 2013-05-08 DIAGNOSIS — I10 Essential (primary) hypertension: Secondary | ICD-10-CM | POA: Insufficient documentation

## 2013-05-08 DIAGNOSIS — Z7982 Long term (current) use of aspirin: Secondary | ICD-10-CM | POA: Insufficient documentation

## 2013-05-08 DIAGNOSIS — E785 Hyperlipidemia, unspecified: Secondary | ICD-10-CM | POA: Insufficient documentation

## 2013-05-08 DIAGNOSIS — Z79899 Other long term (current) drug therapy: Secondary | ICD-10-CM | POA: Insufficient documentation

## 2013-05-08 DIAGNOSIS — N201 Calculus of ureter: Secondary | ICD-10-CM | POA: Insufficient documentation

## 2013-05-08 HISTORY — DX: Benign prostatic hyperplasia without lower urinary tract symptoms: N40.0

## 2013-05-08 HISTORY — DX: Anxiety disorder, unspecified: F41.9

## 2013-05-08 HISTORY — DX: Personal history of urinary calculi: Z87.442

## 2013-05-08 LAB — BASIC METABOLIC PANEL
BUN: 25 mg/dL — ABNORMAL HIGH (ref 6–23)
Calcium: 10 mg/dL (ref 8.4–10.5)
Creatinine, Ser: 1.28 mg/dL (ref 0.50–1.35)
GFR calc Af Amer: 62 mL/min — ABNORMAL LOW (ref >90)
GFR calc non Af Amer: 54 mL/min — ABNORMAL LOW (ref >90)

## 2013-05-08 LAB — CBC WITH DIFFERENTIAL/PLATELET
Basophils Relative: 0 % (ref 0–1)
Eosinophils Absolute: 0.1 10*3/uL (ref 0.0–0.7)
HCT: 38 % — ABNORMAL LOW (ref 39.0–52.0)
Hemoglobin: 13.2 g/dL (ref 13.0–17.0)
MCH: 33.5 pg (ref 26.0–34.0)
MCHC: 34.7 g/dL (ref 30.0–36.0)
Monocytes Absolute: 0.7 10*3/uL (ref 0.1–1.0)
Monocytes Relative: 11 % (ref 3–12)
Neutrophils Relative %: 65 % (ref 43–77)

## 2013-05-08 LAB — URINALYSIS, ROUTINE W REFLEX MICROSCOPIC
Bilirubin Urine: NEGATIVE
Glucose, UA: NEGATIVE mg/dL
Ketones, ur: NEGATIVE mg/dL
Leukocytes, UA: NEGATIVE
pH: 8 (ref 5.0–8.0)

## 2013-05-08 MED ORDER — MORPHINE SULFATE 4 MG/ML IJ SOLN: 6.0000 mg | Freq: Once | INTRAMUSCULAR | Status: AC

## 2013-05-08 MED ORDER — ONDANSETRON HCL 8 MG PO TABS: 8.0000 mg | ORAL_TABLET | Freq: Two times a day (BID) | ORAL | Status: DC | PRN

## 2013-05-08 MED ORDER — ONDANSETRON HCL 4 MG/2ML IJ SOLN
4.0000 mg | Freq: Once | INTRAMUSCULAR | Status: AC
  Administered 2013-05-08: 4 mg via INTRAVENOUS
  Filled 2013-05-08: qty 2

## 2013-05-08 MED ORDER — OXYCODONE-ACETAMINOPHEN 5-325 MG PO TABS: 1.0000 | ORAL_TABLET | ORAL | Status: DC | PRN

## 2013-05-08 MED ORDER — MORPHINE SULFATE 2 MG/ML IJ SOLN
INTRAMUSCULAR | Status: AC
  Administered 2013-05-08: 6 mg via INTRAVASCULAR
  Filled 2013-05-08: qty 3

## 2013-05-08 NOTE — Patient Instructions (Signed)
Jacob Fleming  05/08/2013   Your procedure is scheduled on:  05/09/2013  Report to [facility] at  615 AM.  Call this number if you have problems the morning of surgery: [phone]   Remember:   Do not eat food or drink liquids after midnight.   Take these medicines the morning of surgery with A SIP OF WATER: ativan,flomax, norvasc, chlorthilodone,lisinopril, zofran and percocet   Do not wear jewelry, make-up or nail polish.  Do not wear lotions, powders, or perfumes.  Do not shave 48 hours prior to surgery. Men may shave face and neck.  Do not bring valuables to the hospital.  [facility] is not responsible for any belongings or valuables.  Contacts, dentures or bridgework may not be worn into surgery.  Leave suitcase in the car. After surgery it may be brought to your room.  For patients admitted to the hospital, checkout time is 11:00 AM the day of discharge.   Patients discharged the day of surgery will not be allowed to drive home.  Name and phone number of your driver: family  Special Instructions: N/A   Please read over the following fact sheets that you were given: Pain Booklet, Coughing and Deep Breathing, MRSA Information, Surgical Site Infection Prevention, Anesthesia Post-op Instructions and Care and Recovery After Surgery Cystoscopy Cystoscopy is a procedure that is used to help your caregiver diagnose and sometimes treat conditions that affect your lower urinary tract. Your lower urinary tract includes your bladder and the tube through which urine passes from your bladder out of your body (urethra). Cystoscopy is performed with a thin, tube-shaped instrument (cystoscope). The cystoscope has lenses and a light at the end so that your caregiver can see inside your bladder. The cystoscope is inserted at the entrance of your urethra. Your caregiver guides it through your urethra and into your bladder. There are two main types of cystoscopy:  Flexible cystoscopy (with a  flexible cystoscope).  Rigid cystoscopy (with a rigid cystoscope). Cystoscopy may be recommended for many conditions, including:  Urinary tract infections.  Blood in your urine (hematuria).  Loss of bladder control (urinary incontinence) or overactive bladder.  Unusual cells found in a urine sample.  Urinary blockage.  Painful urination. Cystoscopy may also be done to remove a sample of your tissue to be checked under a microscope (biopsy). It may also be done to remove or destroy bladder stones. LET YOUR CAREGIVER KNOW ABOUT:  Allergies to food or medicine.  Medicines taken, including vitamins, herbs, eyedrops, over-the-counter medicines, and creams.  Use of steroids (by mouth or creams).  Previous problems with anesthetics or numbing medicines.  History of bleeding problems or blood clots.  Previous surgery.  Other health problems, including diabetes and kidney problems.  Possibility of pregnancy, if this applies. PROCEDURE The area around the opening to your urethra will be cleaned. A medicine to numb your urethra (local anesthetic) is used. If a tissue sample or stone is removed during the procedure, you may be given a medicine to make you sleep (general anesthetic). Your caregiver will gently insert the tip of the cystoscope into your urethra. The cystoscope will be slowly glided through your urethra and into your bladder. Sterile fluid will flow through the cystoscope and into your bladder. The fluid will expand and stretch your bladder. This gives your caregiver a better view of your bladder walls. The procedure lasts about 15 20 minutes. AFTER THE PROCEDURE If a local anesthetic is used,  you will be allowed to go home as soon as you are ready. If a general anesthetic is used, you will be taken to a recovery area until you are stable. You may have temporary bleeding and burning on urination. Document Released: 10/08/2000 Document Revised: 07/05/2012 Document Reviewed:  04/03/2012 [facility] Patient Information 2014 [place], [place]. PATIENT INSTRUCTIONS POST-ANESTHESIA  IMMEDIATELY FOLLOWING SURGERY:  Do not drive or operate machinery for the first twenty four hours after surgery.  Do not make any important decisions for twenty four hours after surgery or while taking narcotic pain medications or sedatives.  If you develop intractable nausea and vomiting or a severe headache please notify your doctor immediately.  FOLLOW-UP:  Please make an appointment with your surgeon as instructed. You do not need to follow up with anesthesia unless specifically instructed to do so.  WOUND CARE INSTRUCTIONS (if applicable):  Keep a dry clean dressing on the anesthesia/puncture wound site if there is drainage.  Once the wound has quit draining you may leave it open to air.  Generally you should leave the bandage intact for twenty four hours unless there is drainage.  If the epidural site drains for more than 36-48 hours please call the anesthesia department.  QUESTIONS?:  Please feel free to call your physician or the hospital operator if you have any questions, and they will be happy to assist you.

## 2013-05-08 NOTE — ED Provider Notes (Signed)
D/w dr Jerre Simon Procedure set for tomorrow He does not want antibiotics given at this time  Joya Gaskins, MD 05/08/13 1014

## 2013-05-08 NOTE — ED Provider Notes (Signed)
History    CSN: [id number] Arrival date & time 05/08/13  0440  First MD Initiated Contact with Patient 05/08/13 [phone]     Chief Complaint  Patient presents with  . Flank Pain    The history is provided by the patient.   patient reports developing left-sided flank and abdominal pain last night.  Significantly worsened in the early morning hours.  He developed nausea without vomiting.  No fevers or chills.  He denies dysuria.  He has a history of horseshoe kidney and kidney stones.  He states his otherwise been in his normal state of health over the past several days.  His pain at this time is severe in severity.  Nothing improves his pain.  Nothing worsens his pain.  He states his never had pain this severe before. Past Medical History  Diagnosis Date  . Hyperlipidemia   . Hypertension    Past Surgical History  Procedure Laterality Date  . Tonsillectomy    . Appendectomy    . Back surgery      L5-L6  . Cystoscopy w/ retrogrades      pyelogram   Family History  Problem Relation Age of Onset  . Diabetes Brother   . Diabetes Sister   . Dementia Mother   . Other Father     perforated intestine  . Coronary artery disease    . Diabetes    . Hypertension     History  Substance Use Topics  . Smoking status: Never Smoker   . Smokeless tobacco: Never Used  . Alcohol Use: No    Review of Systems  Genitourinary: Positive for flank pain.  All other systems reviewed and are negative.    Allergies  Review of patient's allergies indicates no known allergies.  Home Medications   Current Outpatient Rx  Name  Route  Sig  Dispense  Refill  . amLODipine (NORVASC) 2.5 MG tablet   Oral   Take 2.5 mg by mouth daily.           Marland Kitchen aspirin 325 MG tablet   Oral   Take 81 mg by mouth daily.          Marland Kitchen b complex vitamins tablet   Oral   Take 1 tablet by mouth daily.         . cetirizine (ZYRTEC) 10 MG tablet   Oral   Take 10 mg by mouth daily as needed.           .  Cholecalciferol (VITAMIN D3) 1000 UNITS CAPS   Oral   Take by mouth daily.         . finasteride (PROSCAR) 5 MG tablet   Oral   Take 5 mg by mouth daily.           Marland Kitchen lisinopril (PRINIVIL,ZESTRIL) 20 MG tablet   Oral   Take 20 mg by mouth daily.           Marland Kitchen LORazepam (ATIVAN) 0.5 MG tablet   Oral   Take 0.5 mg by mouth as needed.         . multivitamin (THERAGRAN) per tablet   Oral   Take 1 tablet by mouth daily.           . Omega-3 Fatty Acids (RA FISH OIL) 900 MG CAPS   Oral   Take 2 tablets by mouth daily.           . simvastatin (ZOCOR) 20 MG tablet   Oral  Take 20 mg by mouth at bedtime.           . Tamsulosin HCl (FLOMAX) 0.4 MG CAPS   Oral   Take 0.4 mg by mouth daily.           Marland Kitchen BIOTIN PO   Oral   Take by mouth daily.         . chlorthalidone (HYGROTON) 25 MG tablet   Oral   Take 25 mg by mouth daily.         . phosphorus (K PHOS NEUTRAL) 155-852-130 MG tablet   Oral   Take 1 tablet by mouth daily.           . potassium chloride (K-DUR) 10 MEQ tablet   Oral   Take 10 mEq by mouth daily.         Marland Kitchen pyridOXINE (VITAMIN B-6) 100 MG tablet   Oral   Take 100 mg by mouth daily.         Marland Kitchen triamcinolone (KENALOG) 0.025 % cream   Topical   Apply topically as needed.            BP 137/63  Pulse 70  Temp(Src) 97.5 F (36.4 C) (Oral)  Resp 39  Ht 5\' 10"  (1.778 m)  Wt 140 lb (63.504 kg)  BMI 20.09 kg/m2  SpO2 100% Physical Exam  Nursing note and vitals reviewed. Constitutional: He is oriented to person, place, and time. He appears well-developed and well-nourished. He appears distressed.  Uncomfortable appearing  HENT:  Head: Normocephalic and atraumatic.  Eyes: EOM are normal.  Neck: Normal range of motion.  Cardiovascular: Normal rate, regular rhythm, normal heart sounds and intact distal pulses.   Pulmonary/Chest: Effort normal and breath sounds normal. No respiratory distress.  Abdominal: Soft. He exhibits no  distension. There is no tenderness.  Genitourinary:  No CVA tenderness  Musculoskeletal: Normal range of motion.  Neurological: He is alert and oriented to person, place, and time.  Skin: Skin is warm and dry.  Psychiatric: He has a normal mood and affect. Judgment normal.    ED Course  Procedures (including critical care time) Labs Reviewed  URINALYSIS, ROUTINE W REFLEX MICROSCOPIC  CBC WITH DIFFERENTIAL  BASIC METABOLIC PANEL   Ct Abdomen Pelvis Wo Contrast  05/08/2013   *RADIOLOGY REPORT*  Clinical Data: Severe left flank pain.  CT ABDOMEN AND PELVIS WITHOUT CONTRAST  Technique:  Multidetector CT imaging of the abdomen and pelvis was performed following the standard protocol without intravenous contrast.  Comparison: CT of the abdomen and pelvis performed 10/07/2010.  Findings: The visualized lung bases are clear.  Relatively diffuse coronary artery calcifications are seen.  The liver and spleen are unremarkable in appearance.  The gallbladder is within normal limits.  The pancreas and adrenal glands are unremarkable.  There is mild hydronephrosis involving the upper pole of the left side of the horseshoe kidney.  This may reflect an underlying stricture, or possibly intermittent obstruction from the large 1.8 cm stone overlying the left vesicoureteral junction.  There appear to be two separate left-sided ureters.  The right side of the horseshoe kidney is more inferiorly positioned, with multiple large renal stones measuring up to 1.1 cm in size.  No definite hydronephrosis is noted on the right side. No obstructing ureteral stones are identified.  Additional tiny nonobstructing renal stones are seen.  Significantly increased left-sided perinephric stranding is seen; this may reflect pyelonephritis.  No free fluid is identified.  The small bowel is unremarkable  in appearance.  The stomach is within normal limits.  No acute vascular abnormalities are seen.  Relatively diffuse calcification is  noted along the abdominal aorta and its branches.  The appendix is normal in caliber, without evidence for appendicitis.  The colon is grossly unremarkable in appearance.  The bladder is mildly distended and otherwise grossly unremarkable in appearance.  Scattered calcification is noted within the prostate.  No inguinal lymphadenopathy is seen.  No acute osseous abnormalities are identified.  Disc space narrowing is noted at L4-L5.  IMPRESSION:  1.  Significantly increased left-sided perinephric stranding seen; this may reflect pyelonephritis.  Would correlate clinically. 2.  Mild hydronephrosis involving the upper pole of the left side of the horseshoe kidney.  This may reflect an underlying stricture, or possibly intermittent obstruction of one of the left-sided ureters from the 1.8 cm stone overlying the left vesicoureteral junction, within the bladder, or could be secondary to pyelonephritis. 3.  Bilateral renal stones seen, larger on the right; right side of the horseshoe kidney is more inferiorly positioned, without evidence of hydronephrosis. 4.  Relatively diffuse calcification along the abdominal aorta and its branches. 5.  Relatively diffuse coronary artery calcifications seen.   Original Report Authenticated By: Tonia Ghent, M.D.   I personally reviewed the imaging tests through PACS system I reviewed available ER/hospitalization records through the EMR   No diagnosis found.  MDM  Concerning for left-sided ureteral stone.  Labs, urine, CT scan pending at this time.  We'll work on pain and nausea.  6:36 AM Patient reports his pain is significantly improved at this time.  Urine sample just obtained.  Blood and urinalysis pending at this time.  She does appear to have left-sided perinephric stranding and left-sided ureteral obstruction as evident by his left-sided mild hydronephrosis.  Urinalysis is negative for urinary tract infection the patient seems to be much more comfortable at this time  he can likely be discharged home with close followup with his urologist at [facility].  His urine shows signs of infection he will likely need to undergo ureteral stent placement today  7:20 AM Spoke with Dr Jerre Simon, he will see in ER.   Lyanne Co, MD 05/08/13 [phone]

## 2013-05-08 NOTE — Consult Note (Signed)
[id number] 

## 2013-05-08 NOTE — Consult Note (Signed)
NAMELORENE, SAMAAN NO.:  [id number]  MEDICAL RECORD NO.:  [id number]  LOCATION:  DOIB                          FACILITY:  [facility]  PHYSICIAN:  Ky Barban, M.D.DATE OF BIRTH:  1941-03-14  DATE OF CONSULTATION:  05/08/2013 DATE OF DISCHARGE:                                CONSULTATION   HISTORY OF PRESENT ILLNESS:  Jacob Fleming is a 72 year old gentleman who came to the emergency room with left renal colic.  CT scan shows there is a large stone in the left ureterovesical junction, causing obstruction and he has a history of having multiple kidney stones and several lithotripsies done in different hospitals including [facility] and he also has a ureteroscopic stone basket extraction in [facility].  He has hypertension, no diabetes, and he is completely comfortable right now, and so I did a KUB.  This renal stones on both sides are visible on KUB. The stone in the ureterovesical junction on the left side appears to be in the bladder.  It is a large stone and he has no voiding difficulty, but he is on several medicines for BPH.  PHYSICAL EXAMINATION:  GENERAL:  Moderately built male, not in acute distress.  Fully conscious, alert, and oriented. ABDOMEN:  Soft, flat.  Liver, spleen, kidneys not palpable.  No CVA tenderness. GU: External genitalia is unremarkable. RECTAL:  Deferred. EXTREMITIES:  Normal.  IMPRESSION:  I reviewed his CT and KUB, it shows there is a large 22-mm stone in the bladder.  So, my recommendation was that I go ahead and do litholapaxy.  The patient is, on Saturday, having some family reunion. He requested that we should do something about the stone, so at least I can do it tomorrow morning.  I can do litholapaxy and send him home with Foley catheter.  I discussed these things with the patient.  He understands, want me to go ahead and proceed.  So, we will schedule it for tomorrow.  Procedure limitation and complications were discussed with the  patient and his daughter.     Ky Barban, M.D.     MIJ/MEDQ  D:  05/08/2013  T:  05/08/2013  Job:  [id number]

## 2013-05-08 NOTE — ED Provider Notes (Signed)
Seen by Jerre Simon Will have procedure tomorrow Stable for d/c, pt seen talking on phone, no distress  Joya Gaskins, MD 05/08/13 1005

## 2013-05-08 NOTE — ED Notes (Signed)
Left flank pain onset couple of hours ago, nausea, no vomiting or diarrhea. Hx of kidney stones

## 2013-05-09 ENCOUNTER — Encounter (HOSPITAL_COMMUNITY): Payer: Self-pay | Admitting: *Deleted

## 2013-05-09 ENCOUNTER — Ambulatory Visit (HOSPITAL_COMMUNITY): Payer: Medicare Other

## 2013-05-09 ENCOUNTER — Ambulatory Visit (HOSPITAL_COMMUNITY): Payer: Medicare Other | Admitting: Anesthesiology

## 2013-05-09 ENCOUNTER — Emergency Department (HOSPITAL_COMMUNITY)
Admission: EM | Admit: 2013-05-09 | Discharge: 2013-05-09 | Disposition: A | Discharge status: Home / Self Care | Payer: Medicare Other | Attending: Emergency Medicine | Admitting: Emergency Medicine

## 2013-05-09 ENCOUNTER — Encounter (HOSPITAL_COMMUNITY): Payer: Self-pay | Admitting: Anesthesiology

## 2013-05-09 ENCOUNTER — Encounter (HOSPITAL_COMMUNITY): Payer: Self-pay

## 2013-05-09 ENCOUNTER — Encounter (HOSPITAL_COMMUNITY)
Admission: RE | Disposition: A | Discharge status: Home / Self Care | Payer: Self-pay | Source: Ambulatory Visit | Attending: Urology

## 2013-05-09 ENCOUNTER — Ambulatory Visit (HOSPITAL_COMMUNITY)
Admission: RE | Admit: 2013-05-09 | Discharge: 2013-05-09 | Disposition: A | Discharge status: Home / Self Care | Payer: Medicare Other | Source: Ambulatory Visit | Attending: Urology | Admitting: Urology

## 2013-05-09 DIAGNOSIS — Z87442 Personal history of urinary calculi: Secondary | ICD-10-CM | POA: Insufficient documentation

## 2013-05-09 DIAGNOSIS — IMO0001 Reserved for inherently not codable concepts without codable children: Secondary | ICD-10-CM

## 2013-05-09 DIAGNOSIS — Z79899 Other long term (current) drug therapy: Secondary | ICD-10-CM | POA: Insufficient documentation

## 2013-05-09 DIAGNOSIS — Z9889 Other specified postprocedural states: Secondary | ICD-10-CM | POA: Insufficient documentation

## 2013-05-09 DIAGNOSIS — E785 Hyperlipidemia, unspecified: Secondary | ICD-10-CM

## 2013-05-09 DIAGNOSIS — T83091A Other mechanical complication of indwelling urethral catheter, initial encounter: Secondary | ICD-10-CM | POA: Insufficient documentation

## 2013-05-09 DIAGNOSIS — N210 Calculus in bladder: Secondary | ICD-10-CM | POA: Insufficient documentation

## 2013-05-09 DIAGNOSIS — N400 Enlarged prostate without lower urinary tract symptoms: Secondary | ICD-10-CM | POA: Insufficient documentation

## 2013-05-09 DIAGNOSIS — I10 Essential (primary) hypertension: Secondary | ICD-10-CM | POA: Insufficient documentation

## 2013-05-09 DIAGNOSIS — Z7982 Long term (current) use of aspirin: Secondary | ICD-10-CM | POA: Insufficient documentation

## 2013-05-09 DIAGNOSIS — N200 Calculus of kidney: Secondary | ICD-10-CM | POA: Insufficient documentation

## 2013-05-09 DIAGNOSIS — Y846 Urinary catheterization as the cause of abnormal reaction of the patient, or of later complication, without mention of misadventure at the time of the procedure: Secondary | ICD-10-CM | POA: Insufficient documentation

## 2013-05-09 DIAGNOSIS — F411 Generalized anxiety disorder: Secondary | ICD-10-CM | POA: Insufficient documentation

## 2013-05-09 DIAGNOSIS — R319 Hematuria, unspecified: Secondary | ICD-10-CM | POA: Insufficient documentation

## 2013-05-09 HISTORY — PX: CYSTOSCOPY/RETROGRADE/URETEROSCOPY/STONE EXTRACTION WITH BASKET: SHX5317

## 2013-05-09 HISTORY — PX: LITHOTRIPSY: SHX5546

## 2013-05-09 HISTORY — PX: HOLMIUM LASER APPLICATION: SHX5852

## 2013-05-09 SURGERY — HOLMIUM LASER APPLICATION
Anesthesia: General | Site: Ureter | Wound class: Clean Contaminated

## 2013-05-09 MED ORDER — IOHEXOL 350 MG/ML SOLN: INTRAVENOUS | Status: DC | PRN

## 2013-05-09 MED ORDER — LACTATED RINGERS IV SOLN
INTRAVENOUS | Status: DC
  Administered 2013-05-09: 07:00:00 via INTRAVENOUS

## 2013-05-09 MED ORDER — ONDANSETRON HCL 4 MG/2ML IJ SOLN
INTRAMUSCULAR | Status: AC
  Filled 2013-05-09: qty 2

## 2013-05-09 MED ORDER — ROCURONIUM BROMIDE 50 MG/5ML IV SOLN
INTRAVENOUS | Status: AC
  Filled 2013-05-09: qty 1

## 2013-05-09 MED ORDER — FENTANYL CITRATE 0.05 MG/ML IJ SOLN
25.0000 ug | INTRAMUSCULAR | Status: AC | PRN
  Administered 2013-05-09 (×2): 25 ug via INTRAVENOUS

## 2013-05-09 MED ORDER — LIDOCAINE HCL (PF) 1 % IJ SOLN
INTRAMUSCULAR | Status: AC
  Filled 2013-05-09: qty 5

## 2013-05-09 MED ORDER — MIDAZOLAM HCL 2 MG/2ML IJ SOLN
INTRAMUSCULAR | Status: AC
  Filled 2013-05-09: qty 2

## 2013-05-09 MED ORDER — FENTANYL CITRATE 0.05 MG/ML IJ SOLN
INTRAMUSCULAR | Status: DC | PRN
  Administered 2013-05-09 (×2): 12.5 ug via INTRAVENOUS

## 2013-05-09 MED ORDER — PROPOFOL 10 MG/ML IV EMUL
INTRAVENOUS | Status: AC
  Filled 2013-05-09: qty 20

## 2013-05-09 MED ORDER — OXYCODONE-ACETAMINOPHEN 7.5-325 MG PO TABS: 1.0000 | ORAL_TABLET | Freq: Four times a day (QID) | ORAL | Status: DC | PRN

## 2013-05-09 MED ORDER — FENTANYL CITRATE 0.05 MG/ML IJ SOLN
25.0000 ug | INTRAMUSCULAR | Status: AC
  Administered 2013-05-09 (×2): 25 ug via INTRAVENOUS

## 2013-05-09 MED ORDER — SODIUM CHLORIDE 0.9 % IR SOLN
Status: DC | PRN
  Administered 2013-05-09: 3000 mL via INTRAVESICAL
  Administered 2013-05-09: 3000 mL
  Administered 2013-05-09 (×3): 3000 mL via INTRAVESICAL

## 2013-05-09 MED ORDER — FENTANYL CITRATE 0.05 MG/ML IJ SOLN
INTRAMUSCULAR | Status: AC
  Filled 2013-05-09: qty 2

## 2013-05-09 MED ORDER — LIDOCAINE HCL (CARDIAC) 10 MG/ML IV SOLN
INTRAVENOUS | Status: DC | PRN
  Administered 2013-05-09: 20 mg via INTRAVENOUS

## 2013-05-09 MED ORDER — MIDAZOLAM HCL 2 MG/2ML IJ SOLN
1.0000 mg | INTRAMUSCULAR | Status: DC | PRN
  Administered 2013-05-09: 2 mg via INTRAVENOUS

## 2013-05-09 MED ORDER — GLYCOPYRROLATE 0.2 MG/ML IJ SOLN
INTRAMUSCULAR | Status: AC
  Filled 2013-05-09: qty 2

## 2013-05-09 MED ORDER — ONDANSETRON HCL 4 MG/2ML IJ SOLN
4.0000 mg | Freq: Once | INTRAMUSCULAR | Status: AC
  Administered 2013-05-09: 4 mg via INTRAVENOUS

## 2013-05-09 MED ORDER — STERILE WATER FOR IRRIGATION IR SOLN
Status: DC | PRN
  Administered 2013-05-09: 1000 mL

## 2013-05-09 MED ORDER — SUCCINYLCHOLINE CHLORIDE 20 MG/ML IJ SOLN
INTRAMUSCULAR | Status: AC
  Filled 2013-05-09: qty 1

## 2013-05-09 MED ORDER — PROPOFOL 10 MG/ML IV BOLUS
INTRAVENOUS | Status: DC | PRN
  Administered 2013-05-09: 10 mg via INTRAVENOUS
  Administered 2013-05-09: 130 mg via INTRAVENOUS
  Administered 2013-05-09 (×2): 20 mg via INTRAVENOUS

## 2013-05-09 SURGICAL SUPPLY — 34 items
0.9% NACL 3000ML BAG IMPLANT
BAG DRAIN URO TABLE W/ADPT NS (DRAPE) ×4 IMPLANT
BAG DRN 8 ADPR NS SKTRN CSTL (DRAPE) ×3
BAG HAMPER (MISCELLANEOUS) ×4 IMPLANT
BAG URINE DRAINAGE (UROLOGICAL SUPPLIES) ×2 IMPLANT
BASKET/GRASPER STONE 3FR 13CM (MISCELLANEOUS) ×2 IMPLANT
CATH FOLEY 2WAY SLVR  5CC 16FR (CATHETERS) ×1
CATH FOLEY 2WAY SLVR 5CC 16FR (CATHETERS) ×1 IMPLANT
CATH OPEN TIP 5FR (CATHETERS) ×2 IMPLANT
CLOTH BEACON ORANGE TIMEOUT ST (SAFETY) ×4 IMPLANT
COVER MAYO STAND XLG (DRAPE) ×2 IMPLANT
DRAPE WARM FLUID 44X44 (DRAPE) ×2 IMPLANT
GLOVE BIO SURGEON STRL SZ7 (GLOVE) ×4 IMPLANT
GLOVE BIOGEL PI IND STRL 7.0 (GLOVE) ×1 IMPLANT
GLOVE BIOGEL PI IND STRL 7.5 (GLOVE) ×2 IMPLANT
GLOVE BIOGEL PI INDICATOR 7.0 (GLOVE) ×1
GLOVE BIOGEL PI INDICATOR 7.5 (GLOVE) ×2
GLOVE ECLIPSE 7.0 STRL STRAW (GLOVE) ×2 IMPLANT
GOWN STRL REIN XL XLG (GOWN DISPOSABLE) ×6 IMPLANT
IV NS IRRIG 3000ML ARTHROMATIC (IV SOLUTION) ×14 IMPLANT
KIT ROOM TURNOVER AP CYSTO (KITS) ×4 IMPLANT
KNIFE DISP COLD (ELECTROSURGICAL) ×2 IMPLANT
LASER FIBER DISP (UROLOGICAL SUPPLIES) ×4 IMPLANT
MANIFOLD NEPTUNE II (INSTRUMENTS) ×4 IMPLANT
OMNIPAQUE 350MG/ML ×100 IMPLANT
PACK CYSTO (CUSTOM PROCEDURE TRAY) ×4 IMPLANT
PAD ARMBOARD 7.5X6 YLW CONV (MISCELLANEOUS) ×4 IMPLANT
SET IRRIGATING DISP (SET/KITS/TRAYS/PACK) ×4 IMPLANT
STENT PERCUFLEX 4.8FRX24 (STENTS) ×2 IMPLANT
STONE RETRIEVAL GEMINI 2.4 FR (MISCELLANEOUS) ×2 IMPLANT
SYRINGE IRR TOOMEY STRL 70CC (SYRINGE) ×2 IMPLANT
TOWEL OR 17X26 4PK STRL BLUE (TOWEL DISPOSABLE) ×4 IMPLANT
WATER STERILE IRR 1000ML POUR (IV SOLUTION) ×4 IMPLANT
WIRE GUIDE BENTSON .035 15CM (WIRE) ×2 IMPLANT

## 2013-05-09 NOTE — Transfer of Care (Signed)
Immediate Anesthesia Transfer of Care Note  Patient: Jacob Fleming  Procedure(s) Performed: Procedure(s) (LRB): HOLMIUM LASER APPLICATION (N/A) CYSTOSCOPY/LEFT RETROGRADE/URETEROSCOPY/STONE EXTRACTION WITH BASKET (Left) LITHOTRIPSY (N/A)  Patient Location: PACU  Anesthesia Type: General  Level of Consciousness: awake  Airway & Oxygen Therapy: Patient Spontanous Breathing  Post-op Assessment: Report given to PACU RN, Post -op Vital signs reviewed and stable and Patient moving all extremities  Post vital signs: Reviewed and stable  Complications: No apparent anesthesia complications

## 2013-05-09 NOTE — ED Provider Notes (Signed)
History    This chart was scribed for Bethann Berkshire, MD by Donne Anon, ED Scribe. This patient was seen in room APA03/[facility]03 and the patient's care was started at 2034.  CSN: [id number] Arrival date & time 05/09/13  2021  First MD Initiated Contact with Patient 05/09/13 2034     Chief Complaint  Patient presents with  . Post-op Problem   (Consider location/radiation/quality/duration/timing/severity/associated sxs/prior Treatment) Patient is a 72 y.o. male presenting with hematuria. The history is provided by the patient. No language interpreter was used.  Hematuria This is a new problem. The current episode started 6 to 12 hours ago. The problem occurs constantly. The problem has not changed since onset.Pertinent negatives include no chest pain, no abdominal pain and no headaches. Nothing aggravates the symptoms. The symptoms are relieved by lying down. He has tried nothing for the symptoms. The treatment provided no relief.   HPI Comments: Jacob Fleming is a 72 y.o. male who presents to the Emergency Department complaining of bleeding from his foley catheter, which was placed this morning when he a stent placed by Dr. Jerre Simon. He states it has been bleeding since he left the hospital. He tried to contact Dr. Jerre Simon with no success.  Past Medical History  Diagnosis Date  . Hyperlipidemia   . Hypertension   . Anxiety   . BPH (benign prostatic hyperplasia)   . History of kidney stones    Past Surgical History  Procedure Laterality Date  . Tonsillectomy    . Appendectomy    . Back surgery      L5-L6  . Cystoscopy w/ retrogrades      pyelogram   Family History  Problem Relation Age of Onset  . Diabetes Brother   . Diabetes Sister   . Dementia Mother   . Other Father     perforated intestine  . Coronary artery disease    . Diabetes    . Hypertension     History  Substance Use Topics  . Smoking status: Never Smoker   . Smokeless tobacco: Never Used  . Alcohol Use: No     Review of Systems  Constitutional: Negative for appetite change and fatigue.  HENT: Negative for congestion, sinus pressure and ear discharge.   Eyes: Negative for discharge.  Respiratory: Negative for cough.   Cardiovascular: Negative for chest pain.  Gastrointestinal: Negative for abdominal pain and diarrhea.  Genitourinary: Positive for hematuria. Negative for frequency.  Musculoskeletal: Negative for back pain.  Skin: Negative for rash.  Neurological: Negative for seizures and headaches.  Psychiatric/Behavioral: Negative for hallucinations.    Allergies  Review of patient's allergies indicates no known allergies.  Home Medications   Current Outpatient Rx  Name  Route  Sig  Dispense  Refill  . amLODipine (NORVASC) 2.5 MG tablet   Oral   Take 2.5 mg by mouth daily.          Marland Kitchen aspirin 81 MG chewable tablet   Oral   Chew 81 mg by mouth daily.         Marland Kitchen b complex vitamins tablet   Oral   Take 1 tablet by mouth daily.         Marland Kitchen BIOTIN PO   Oral   Take by mouth daily.         . chlorthalidone (HYGROTON) 25 MG tablet   Oral   Take 25 mg by mouth daily.         . Cholecalciferol (VITAMIN D3)  1000 UNITS CAPS   Oral   Take 1,000 Units by mouth daily.          Marland Kitchen lisinopril (PRINIVIL,ZESTRIL) 20 MG tablet   Oral   Take 20 mg by mouth daily.           Marland Kitchen LORazepam (ATIVAN) 0.5 MG tablet   Oral   Take 0.5 mg by mouth every 8 (eight) hours as needed for anxiety.          . methylcellulose (ARTIFICIAL TEARS) 1 % ophthalmic solution   Both Eyes   Place 1 drop into both eyes 2 (two) times daily as needed (dry eyes).         . multivitamin (THERAGRAN) per tablet   Oral   Take 1 tablet by mouth daily.           . ondansetron (ZOFRAN) 8 MG tablet   Oral   Take 1 tablet (8 mg total) by mouth every 12 (twelve) hours as needed for nausea.   8 tablet   0   . oxyCODONE-acetaminophen (PERCOCET) 7.5-325 MG per tablet   Oral   Take 1 tablet by mouth  every 6 (six) hours as needed for pain.   30 tablet   0   . oxyCODONE-acetaminophen (PERCOCET/ROXICET) 5-325 MG per tablet   Oral   Take 1 tablet by mouth every 4 (four) hours as needed for pain.   15 tablet   0   . phosphorus (K PHOS NEUTRAL) 155-852-130 MG tablet   Oral   Take 1 tablet by mouth daily.           . potassium chloride (K-DUR) 10 MEQ tablet   Oral   Take 10 mEq by mouth daily.         Marland Kitchen pyridOXINE (VITAMIN B-6) 100 MG tablet   Oral   Take 100 mg by mouth daily.         . simvastatin (ZOCOR) 20 MG tablet   Oral   Take 20 mg by mouth at bedtime.           . Tamsulosin HCl (FLOMAX) 0.4 MG CAPS   Oral   Take 0.4 mg by mouth daily.           Marland Kitchen triamcinolone (KENALOG) 0.025 % cream   Topical   Apply topically as needed.           BP 144/64  Pulse 71  Temp(Src) 98.1 F (36.7 C) (Oral)  Resp 16  Ht 5\' 11"  (1.803 m)  Wt 140 lb (63.504 kg)  BMI 19.53 kg/m2  SpO2 100% Physical Exam  Constitutional: He is oriented to person, place, and time. He appears well-developed.  HENT:  Head: Normocephalic.  Eyes: Conjunctivae are normal.  Neck: No tracheal deviation present.  Cardiovascular:  No murmur heard. Genitourinary:  Foley catheter in place. Bleeding surrounding insertion of catheter.   Musculoskeletal: Normal range of motion.  Neurological: He is oriented to person, place, and time.  Skin: Skin is warm.  Psychiatric: He has a normal mood and affect.    ED Course  Procedures (including critical care time) DIAGNOSTIC STUDIES: Oxygen Saturation is 100% on RA, normal by my interpretation.    COORDINATION OF CARE: 9:13 PM Discussed treatment plan which includes follow up with Dr. Jerre Simon with pt at bedside and pt agreed to plan. Case discussed with Dr. Jerre Simon, who agreed to see pt tomorrow.    1. Hematuria     MDM  The chart was  scribed for me under my direct supervision.  I personally performed the history, physical, and medical  decision making and all procedures in the evaluation of this patient.Benny Lennert, MD 05/09/13 2149

## 2013-05-09 NOTE — H&P (Signed)
NAMEPEARSON, PICOU NO.:  [id number]  MEDICAL RECORD NO.:  [id number]  LOCATION:  APPO                          FACILITY:  [facility]  PHYSICIAN:  Ky Barban, M.D.DATE OF BIRTH:  [birth year]  DATE OF ADMISSION:  05/09/2013 DATE OF DISCHARGE:  07/16/2014LH                             HISTORY & PHYSICAL   A 72 year old gentleman, who was seen by me in the emergency room with left renal colic.  Subsequently, the pain subsided and CT scan showed that there was a large stone in the left ureterovesical junction, causing obstruction.  Subsequently, when a KUB was done, the stone has moved into the bladder.  Larina Bras is rather large.  It is about 22 mm in size, so I decided to go ahead and do cystoscopy, litholapaxy to remove the stone.  Has no fever, chills, or gross hematuria.  No voiding difficulty.  Has history of having kidney stones in the past.  PAST MEDICAL HISTORY: 1. He has hypertension, well controlled. 2. History of having kidney stones.  Had several lithotripsies done in     the past.  He still has bilateral renal calculi.  There are large     stones in the right kidney causing no obstruction.  So, I am     planning to go ahead and do a litholapaxy.  No history of diabetes.  They said he does have hypertension which is well controlled with medicines.  FAMILY HISTORY:  Negative.  No history of prostate cancer.  REVIEW OF SYSTEMS:  Unremarkable.  PERSONAL HISTORY:  He does not smoke or drink.  PHYSICAL EXAMINATION:  GENERAL:  Moderately built male, not in acute distress, fully conscious, alert, and oriented. VITAL SIGNS:  His blood pressure is 127/64, temperature is 98.7, O2 saturation is 98% on room air.  LABORATORY DATA:  WBC count is 6000, hematocrit 38%, sodium 140, potassium 4.4, chloride 103, CO2 is 29, BUN is 25, creatinine is 1.28. Serum calcium is 10.  IMPRESSION:  Bladder stone.  PLAN:  Cystolitholapaxy under anesthesia as outpatient.   He will go home with the catheter if everything is fine, which I can keep it there for couple of days and then remove it in the office.  I will remove the catheter tomorrow in the office.     Ky Barban, M.D.     MIJ/MEDQ  D:  05/09/2013  T:  05/09/2013  Job:  [id number]

## 2013-05-09 NOTE — Anesthesia Procedure Notes (Signed)
Procedure Name: LMA Insertion Date/Time: 05/09/2013 7:53 AM Performed by: Franco Nones Pre-anesthesia Checklist: Patient identified, Patient being monitored, Emergency Drugs available, Timeout performed and Suction available Patient Re-evaluated:Patient Re-evaluated prior to inductionOxygen Delivery Method: Circle System Utilized Preoxygenation: Pre-oxygenation with 100% oxygen Intubation Type: IV induction Ventilation: Mask ventilation without difficulty LMA: LMA inserted LMA Size: 4.0 Number of attempts: 1 Placement Confirmation: positive ETCO2 and breath sounds checked- equal and bilateral

## 2013-05-09 NOTE — Anesthesia Postprocedure Evaluation (Signed)
  Anesthesia Post-op Note  Patient: Jacob Fleming  Procedure(s) Performed: Procedure(s): HOLMIUM LASER APPLICATION (N/A) CYSTOSCOPY/LEFT RETROGRADE/URETEROSCOPY/STONE EXTRACTION WITH BASKET (Left) LITHOTRIPSY (N/A)  Patient Location: PACU  Anesthesia Type:General  Level of Consciousness: awake, alert  and patient cooperative  Airway and Oxygen Therapy: Patient Spontanous Breathing  Post-op Pain: none  Post-op Assessment: Post-op Vital signs reviewed, Patient's Cardiovascular Status Stable, Respiratory Function Stable, Patent Airway and No signs of Nausea or vomiting  Post-op Vital Signs: Reviewed and stable  Complications: No apparent anesthesia complications

## 2013-05-09 NOTE — Brief Op Note (Signed)
05/09/2013  9:56 AM  PATIENT:  Jacob Fleming  72 y.o. male  PRE-OPERATIVE DIAGNOSIS:  bladder stone  POST-OPERATIVE DIAGNOSIS:  * No post-op diagnosis entered *  PROCEDURE:  Procedure(s): CYSTOSCOPY WITH LITHOLAPAXY (N/A) HOLMIUM LASER APPLICATION (N/A) CYSTOSCOPY/LEFT RETROGRADE/URETEROSCOPY/STONE EXTRACTION WITH BASKET (Left)  SURGEON:  Surgeon(s) and Role:    * Ky Barban, MD - Primary  PHYSICIAN ASSISTANT:   ASSISTANTS: none   ANESTHESIA:   general  EBL:  Total I/O In: 700 [I.V.:700] Out: 75 [Blood:75]  BLOOD ADMINISTERED:none  DRAINS:    LOCAL MEDICATIONS USED:  NONE  SPECIMEN:  No Specimen stone given to family to bring in officeto be sent to lab for analysis.  DISPOSITION OF SPECIMEN:  N/A  COUNTS:  YES  TOURNIQUET:  * No tourniquets in log *  DICTATION: .Other Dictation: Dictation Number [id number]  PLAN OF CARE: Discharge to home after PACU  PATIENT DISPOSITION:  PACU - hemodynamically stable.   Delay start of Pharmacological VTE agent (>24hrs) due to surgical blood loss or risk of bleeding:

## 2013-05-09 NOTE — Progress Notes (Signed)
No change in H&P on reexamination. 

## 2013-05-09 NOTE — OR Nursing (Signed)
Pt had history and physical done in emergency department on 05/08/13 and Dr. Jerre Simon transcribed consult note on 05/08/13.  RN heard Dr. Frann Rider transcribe update for this morning's surgery.

## 2013-05-09 NOTE — Anesthesia Preprocedure Evaluation (Signed)
Anesthesia Evaluation  Patient identified by MRN, date of birth, ID band Patient awake    Reviewed: Allergy & Precautions, H&P , NPO status , Patient's Chart, lab work & pertinent test results  History of Anesthesia Complications Negative for: history of anesthetic complications  Airway Mallampati: I  TM Distance: >3 FB     Dental  (+) Teeth Intact   Pulmonary neg pulmonary ROS,    Pulmonary exam normal       Cardiovascular hypertension, Rhythm:Regular Rate:Normal     Neuro/Psych PSYCHIATRIC DISORDERS Anxiety    GI/Hepatic   Endo/Other    Renal/GU      Musculoskeletal   Abdominal   Peds  Hematology   Anesthesia Other Findings   Reproductive/Obstetrics                             Anesthesia Physical Anesthesia Plan  ASA: II  Anesthesia Plan: General   Post-op Pain Management:    Induction: Intravenous  Airway Management Planned: LMA  Additional Equipment:   Intra-op Plan:   Post-operative Plan: Extubation in OR  Informed Consent: I have reviewed the patients History and Physical, chart, labs and discussed the procedure including the risks, benefits and alternatives for the proposed anesthesia with the patient or authorized representative who has indicated his/her understanding and acceptance.     Plan Discussed with:   Anesthesia Plan Comments:         Anesthesia Quick Evaluation  

## 2013-05-09 NOTE — ED Notes (Signed)
I have a stent placed in my penis this morning and they put in a catheter, it is leaking around the catheter per pt. Just want it checked to make sure nothing is wrong per pt.

## 2013-05-09 NOTE — H&P (Signed)
[phone]

## 2013-05-10 ENCOUNTER — Encounter (HOSPITAL_COMMUNITY): Payer: Self-pay | Admitting: Urology

## 2013-05-10 DIAGNOSIS — N200 Calculus of kidney: Secondary | ICD-10-CM | POA: Insufficient documentation

## 2013-05-10 DIAGNOSIS — R339 Retention of urine, unspecified: Secondary | ICD-10-CM | POA: Insufficient documentation

## 2013-05-10 DIAGNOSIS — Z7982 Long term (current) use of aspirin: Secondary | ICD-10-CM | POA: Insufficient documentation

## 2013-05-10 DIAGNOSIS — Z79899 Other long term (current) drug therapy: Secondary | ICD-10-CM | POA: Insufficient documentation

## 2013-05-10 DIAGNOSIS — R300 Dysuria: Secondary | ICD-10-CM | POA: Insufficient documentation

## 2013-05-10 DIAGNOSIS — R319 Hematuria, unspecified: Secondary | ICD-10-CM | POA: Insufficient documentation

## 2013-05-10 DIAGNOSIS — I10 Essential (primary) hypertension: Secondary | ICD-10-CM | POA: Insufficient documentation

## 2013-05-10 DIAGNOSIS — F411 Generalized anxiety disorder: Secondary | ICD-10-CM | POA: Insufficient documentation

## 2013-05-10 DIAGNOSIS — Z87442 Personal history of urinary calculi: Secondary | ICD-10-CM | POA: Insufficient documentation

## 2013-05-10 DIAGNOSIS — N400 Enlarged prostate without lower urinary tract symptoms: Secondary | ICD-10-CM | POA: Insufficient documentation

## 2013-05-10 DIAGNOSIS — R109 Unspecified abdominal pain: Secondary | ICD-10-CM | POA: Insufficient documentation

## 2013-05-10 DIAGNOSIS — E785 Hyperlipidemia, unspecified: Secondary | ICD-10-CM | POA: Insufficient documentation

## 2013-05-10 LAB — URINE CULTURE: Culture: NO GROWTH

## 2013-05-10 NOTE — Op Note (Signed)
[name], Jacob Fleming                 ACCOUNT NO.:  [id number]  MEDICAL RECORD NO.:  [id number]  LOCATION:                                 FACILITY:  PHYSICIAN:  Ky Barban, M.D.DATE OF BIRTH:  May 15, 1941  DATE OF PROCEDURE:  05/09/2013 DATE OF DISCHARGE:  05/09/2013                              OPERATIVE REPORT   PREOPERATIVE DIAGNOSIS:  Bladder calculus.  POSTOPERATIVE DIAGNOSIS:  Left ureteral calculus.  PROCEDURE:  Cystoscopy, left ureteroscopic stone basket with holmium laser lithotripsy, insertion of double-J stent size 5-French 24 cm, no string attached.  Insertion of Foley catheter.  PROCEDURE:  The patient in lithotomy position.  After usual prep and drape, under general anesthesia, I tried to introduce the cystoscope. He has a submeatal stricture which I had dilated to a 28-French and #25 cystoscope was introduced into the bladder.  It is inspected.  The stone which I thought was in the bladder.  It is in the ureterovesical junction.  It is still in the intramural ureter which is there is lot of edema around the orifice.  With some difficulty, I was able to see the orifice with a cold knife.  I did cut the ureteral orifice a little bit and then I put short rigid ureteroscope through it went to the level of the stone is visualized which is located in the intramural ureter going all the way into the ureterovesical junction.  Once the stone was visualized, I passed a guidewire which went up into the renal pelvis and now the short rigid ureteroscope alongside the guidewire was reintroduced into the level of the stone under direct vision.  The stone is broken with the help of the laser fiber using 450 laser fiber.  Then, the pieces of the stones were basketed gradually.  Most of the stone was taken out and I am not sure if the entire pieces came out, but the ureteroscope I was able to pass all the way through the ureterovesical junction into the lower ureter.  So, I  decided to finish the procedure at this point, and with the help of the [facility] evacuator, there were pieces of stones in the bladder which were evacuated.  Now, a guidewire was passed through the cystoscope retrogradely and over the guidewire, I introduced short  #5-French 24 cm double-J stent.  It was advanced over the renal pelvis under the fluoroscopic control.  The stent is advanced into the renal pelvis after removing the guidewire the nice loop in the renal pelvis and the bladder was obtained.  At this point, all the instruments were removed.  Specimen has been removed and #16 Foley catheter was inserted in the bladder for drainage which I will take it out.  If the urine is clear, I will take it out before he leaves from the recovery room.     Ky Barban, M.D.    MIJ/MEDQ  D:  05/09/2013  T:  05/09/2013  Job:  [id number]

## 2013-05-11 ENCOUNTER — Encounter (HOSPITAL_COMMUNITY): Payer: Self-pay

## 2013-05-11 ENCOUNTER — Emergency Department (HOSPITAL_COMMUNITY)
Admission: EM | Admit: 2013-05-11 | Discharge: 2013-05-11 | Disposition: A | Discharge status: Home / Self Care | Payer: Medicare Other | Attending: Emergency Medicine | Admitting: Emergency Medicine

## 2013-05-11 DIAGNOSIS — R338 Other retention of urine: Secondary | ICD-10-CM

## 2013-05-11 DIAGNOSIS — R319 Hematuria, unspecified: Secondary | ICD-10-CM

## 2013-05-11 LAB — CBC
Hemoglobin: 11.9 g/dL — ABNORMAL LOW (ref 13.0–17.0)
MCV: 96.9 fL (ref 78.0–100.0)
Platelets: 155 10*3/uL (ref 150–400)
RBC: 3.57 MIL/uL — ABNORMAL LOW (ref 4.22–5.81)
WBC: 8.6 10*3/uL (ref 4.0–10.5)

## 2013-05-11 LAB — BASIC METABOLIC PANEL
Chloride: 103 mEq/L (ref 96–112)
GFR calc Af Amer: 79 mL/min — ABNORMAL LOW (ref >90)
Potassium: 3.8 mEq/L (ref 3.5–5.1)

## 2013-05-11 LAB — URINALYSIS, ROUTINE W REFLEX MICROSCOPIC
Bilirubin Urine: NEGATIVE
Nitrite: NEGATIVE
Specific Gravity, Urine: 1.02 (ref 1.005–1.030)
pH: 7.5 (ref 5.0–8.0)

## 2013-05-11 LAB — URINE MICROSCOPIC-ADD ON

## 2013-05-11 NOTE — ED Notes (Signed)
Discharge instructions reviewed with pt, questions answered. Pt verbalized understanding.  

## 2013-05-11 NOTE — ED Notes (Signed)
Pt reports feeling 100% better than his arrival condition

## 2013-05-11 NOTE — ED Notes (Signed)
Leg bag placed on pt

## 2013-05-11 NOTE — ED Notes (Signed)
I had lithotripsy yesterday and had a foley cath, they took out my foley today and now I am just dripping blood per pt. Having pain in my back and abdomen per pt.

## 2013-05-11 NOTE — ED Provider Notes (Signed)
History    CSN: [id number] Arrival date & time 05/10/13  2335  First MD Initiated Contact with Patient 05/11/13 0011     Chief Complaint  Patient presents with  . Nephrolithiasis  . Urinary Retention   (Consider location/radiation/quality/duration/timing/severity/associated sxs/prior Treatment) HPI History provided by patient. Recent kidney stone, underwent lithotripsy and had stent placement. He had a Foley placed at that time which was removed earlier today in the office. Urologist is Dr. Jerre Simon.  Today he developed urinary retention, hematuria prior that. Progressively worsening suprapubic discomfort, presents here in severe discomfort. Is on Bactrim for the next 10 days. Taking pain medications as prescribed. No fevers. No vomiting. Has some bilateral flank pain.   Past Medical History  Diagnosis Date  . Hyperlipidemia   . Hypertension   . Anxiety   . BPH (benign prostatic hyperplasia)   . History of kidney stones    Past Surgical History  Procedure Laterality Date  . Tonsillectomy    . Appendectomy    . Back surgery      L5-L6  . Cystoscopy w/ retrogrades      pyelogram  . Holmium laser application N/A 05/09/2013    Procedure: HOLMIUM LASER APPLICATION;  Surgeon: Ky Barban, MD;  Location: [facility];  Service: Urology;  Laterality: N/A;  . Cystoscopy/retrograde/ureteroscopy/stone extraction with basket Left 05/09/2013    Procedure: CYSTOSCOPY/LEFT RETROGRADE/URETEROSCOPY/STONE EXTRACTION WITH BASKET;  Surgeon: Ky Barban, MD;  Location: [facility];  Service: Urology;  Laterality: Left;  . Lithotripsy N/A 05/09/2013    Procedure: LITHOTRIPSY;  Surgeon: Ky Barban, MD;  Location: [facility];  Service: Urology;  Laterality: N/A;   Family History  Problem Relation Age of Onset  . Diabetes Brother   . Diabetes Sister   . Dementia Mother   . Other Father     perforated intestine  . Coronary artery disease    . Diabetes    . Hypertension     History   Substance Use Topics  . Smoking status: Never Smoker   . Smokeless tobacco: Never Used  . Alcohol Use: No    Review of Systems  Constitutional: Negative for fever and chills.  HENT: Negative for neck pain and neck stiffness.   Eyes: Negative for pain.  Respiratory: Negative for shortness of breath.   Cardiovascular: Negative for chest pain.  Gastrointestinal: Negative for abdominal pain.  Genitourinary: Positive for hematuria, flank pain and difficulty urinating.  Musculoskeletal: Negative for back pain.  Skin: Negative for rash.  Neurological: Negative for headaches.  All other systems reviewed and are negative.    Allergies  Review of patient's allergies indicates no known allergies.  Home Medications   Current Outpatient Rx  Name  Route  Sig  Dispense  Refill  . amLODipine (NORVASC) 2.5 MG tablet   Oral   Take 2.5 mg by mouth daily.          Marland Kitchen aspirin 81 MG chewable tablet   Oral   Chew 81 mg by mouth daily.         Marland Kitchen b complex vitamins tablet   Oral   Take 1 tablet by mouth daily.         Marland Kitchen BIOTIN PO   Oral   Take by mouth daily.         . chlorthalidone (HYGROTON) 25 MG tablet   Oral   Take 25 mg by mouth daily.         . Cholecalciferol (VITAMIN D3) 1000  UNITS CAPS   Oral   Take 1,000 Units by mouth daily.          Marland Kitchen lisinopril (PRINIVIL,ZESTRIL) 20 MG tablet   Oral   Take 20 mg by mouth daily.           Marland Kitchen LORazepam (ATIVAN) 0.5 MG tablet   Oral   Take 0.5 mg by mouth every 8 (eight) hours as needed for anxiety.          . methylcellulose (ARTIFICIAL TEARS) 1 % ophthalmic solution   Both Eyes   Place 1 drop into both eyes 2 (two) times daily as needed (dry eyes).         . multivitamin (THERAGRAN) per tablet   Oral   Take 1 tablet by mouth daily.           . ondansetron (ZOFRAN) 8 MG tablet   Oral   Take 1 tablet (8 mg total) by mouth every 12 (twelve) hours as needed for nausea.   8 tablet   0   .  oxyCODONE-acetaminophen (PERCOCET) 7.5-325 MG per tablet   Oral   Take 1 tablet by mouth every 6 (six) hours as needed for pain.   30 tablet   0   . oxyCODONE-acetaminophen (PERCOCET/ROXICET) 5-325 MG per tablet   Oral   Take 1 tablet by mouth every 4 (four) hours as needed for pain.   15 tablet   0   . phosphorus (K PHOS NEUTRAL) 155-852-130 MG tablet   Oral   Take 1 tablet by mouth daily.           . potassium chloride (K-DUR) 10 MEQ tablet   Oral   Take 10 mEq by mouth daily.         Marland Kitchen pyridOXINE (VITAMIN B-6) 100 MG tablet   Oral   Take 100 mg by mouth daily.         . simvastatin (ZOCOR) 20 MG tablet   Oral   Take 20 mg by mouth at bedtime.           . Tamsulosin HCl (FLOMAX) 0.4 MG CAPS   Oral   Take 0.4 mg by mouth daily.           Marland Kitchen triamcinolone (KENALOG) 0.025 % cream   Topical   Apply topically as needed.           BP 142/67  Pulse 80  Temp(Src) 97.7 F (36.5 C) (Oral)  Resp 18  Ht 5\' 10"  (1.778 m)  Wt 140 lb (63.504 kg)  BMI 20.09 kg/m2  SpO2 98% Physical Exam  Constitutional: He is oriented to person, place, and time. He appears well-developed and well-nourished.  HENT:  Head: Normocephalic and atraumatic.  Eyes: EOM are normal. Pupils are equal, round, and reactive to light.  Neck: Neck supple.  Cardiovascular: Regular rhythm and intact distal pulses.   Pulmonary/Chest: Effort normal. No respiratory distress.  Abdominal: Soft. He exhibits no distension. There is no tenderness.  No CVA tenderness. Mild bilateral flank tenderness. No acute abdomen. Suprapubic fullness and tenderness  Musculoskeletal: Normal range of motion. He exhibits no edema.  Neurological: He is alert and oriented to person, place, and time.  Skin: Skin is warm and dry.    ED Course  Procedures (including critical care time) Labs Reviewed  CBC - Abnormal; Notable for the following:    RBC 3.57 (*)    Hemoglobin 11.9 (*)    HCT 34.6 (*)    All  other  components within normal limits  URINALYSIS, ROUTINE W REFLEX MICROSCOPIC - Abnormal; Notable for the following:    Color, Urine RED (*)    APPearance HAZY (*)    Hgb urine dipstick LARGE (*)    Protein, ur 100 (*)    Leukocytes, UA SMALL (*)    All other components within normal limits  BASIC METABOLIC PANEL - Abnormal; Notable for the following:    Glucose, Bld 107 (*)    GFR calc non Af Amer 68 (*)    GFR calc Af Amer 79 (*)    All other components within normal limits  URINE MICROSCOPIC-ADD ON - Abnormal; Notable for the following:    Bacteria, UA FEW (*)    All other components within normal limits   Dg C-arm 1-60 Min-no Report  05/09/2013   *RADIOLOGY REPORT*  Clinical Data: Left ureteral calculus.  DG C-ARM 1-60 MIN - NRPT [facility]  Technique: C-arm fluoroscopy was provided intraoperatively for Dr. Jerre Simon.  Comparison:  Abdominal film on 05/08/2013  Findings: Intraoperative spot images demonstrate guide wire advancement in the left ureter and placement of a left ureteral stent which appears appropriately positioned.  IMPRESSION: C-arm fluoroscopy provided for urologic procedure.  A left ureteral stent appears appropriately positioned.   Original Report Authenticated By: Irish Lack, M.D.    Foley placed and flushed to clear. 400 cc bloody urine output. No clots.  3:00 AM feeling significantly better and requesting to be discharged home. Plan Foley leg bag, followup urologist. He will continue Bactrim and pain medications and antiemetics as prescribed. Strict return precautions verbalized as understood.  MDM  Urinary retention status post Foley removal earlier today, recent lithotripsy for kidney stones  UA reviewed as above. Labs reviewed and no leukocytosis or overwhelming evidence of UTI.  Previous records reviewed including left ureteral stent placement as above  Vital signs and nursing notes reviewed and considered  Sunnie Nielsen, MD 05/11/13 0301

## 2013-05-22 ENCOUNTER — Other Ambulatory Visit (HOSPITAL_COMMUNITY): Payer: Self-pay | Admitting: Urology

## 2013-05-22 ENCOUNTER — Ambulatory Visit (HOSPITAL_COMMUNITY)
Admission: RE | Admit: 2013-05-22 | Discharge: 2013-05-22 | Disposition: A | Discharge status: Home / Self Care | Payer: Medicare Other | Source: Ambulatory Visit | Attending: Urology | Admitting: Urology

## 2013-05-22 DIAGNOSIS — N200 Calculus of kidney: Secondary | ICD-10-CM | POA: Insufficient documentation

## 2013-05-22 DIAGNOSIS — N201 Calculus of ureter: Secondary | ICD-10-CM

## 2013-06-26 ENCOUNTER — Other Ambulatory Visit (HOSPITAL_COMMUNITY): Payer: Self-pay | Admitting: Urology

## 2013-06-26 DIAGNOSIS — N209 Urinary calculus, unspecified: Secondary | ICD-10-CM

## 2013-06-28 ENCOUNTER — Ambulatory Visit (HOSPITAL_COMMUNITY)
Admission: RE | Admit: 2013-06-28 | Discharge: 2013-06-28 | Disposition: A | Discharge status: Home / Self Care | Payer: Medicare Other | Source: Ambulatory Visit | Attending: Urology | Admitting: Urology

## 2013-06-28 DIAGNOSIS — N200 Calculus of kidney: Secondary | ICD-10-CM | POA: Insufficient documentation

## 2013-06-28 DIAGNOSIS — M5137 Other intervertebral disc degeneration, lumbosacral region: Secondary | ICD-10-CM | POA: Insufficient documentation

## 2013-06-28 DIAGNOSIS — N209 Urinary calculus, unspecified: Secondary | ICD-10-CM

## 2013-06-28 DIAGNOSIS — M51379 Other intervertebral disc degeneration, lumbosacral region without mention of lumbar back pain or lower extremity pain: Secondary | ICD-10-CM | POA: Insufficient documentation

## 2013-07-18 ENCOUNTER — Other Ambulatory Visit (HOSPITAL_COMMUNITY): Payer: Self-pay | Admitting: Urology

## 2013-07-18 DIAGNOSIS — N201 Calculus of ureter: Secondary | ICD-10-CM

## 2013-07-18 DIAGNOSIS — N23 Unspecified renal colic: Secondary | ICD-10-CM

## 2013-07-18 DIAGNOSIS — N200 Calculus of kidney: Secondary | ICD-10-CM

## 2013-07-20 ENCOUNTER — Ambulatory Visit (HOSPITAL_COMMUNITY)
Admission: RE | Admit: 2013-07-20 | Discharge: 2013-07-20 | Disposition: A | Discharge status: Home / Self Care | Payer: Medicare Other | Source: Ambulatory Visit | Attending: Urology | Admitting: Urology

## 2013-07-20 DIAGNOSIS — N200 Calculus of kidney: Secondary | ICD-10-CM

## 2013-07-20 DIAGNOSIS — R109 Unspecified abdominal pain: Secondary | ICD-10-CM | POA: Insufficient documentation

## 2013-07-20 DIAGNOSIS — N201 Calculus of ureter: Secondary | ICD-10-CM

## 2013-08-04 ENCOUNTER — Emergency Department (HOSPITAL_COMMUNITY)
Admission: EM | Admit: 2013-08-04 | Discharge: 2013-08-04 | Disposition: A | Discharge status: Home / Self Care | Payer: Medicare Other | Attending: Emergency Medicine | Admitting: Emergency Medicine

## 2013-08-04 ENCOUNTER — Encounter (HOSPITAL_COMMUNITY): Payer: Self-pay | Admitting: Emergency Medicine

## 2013-08-04 DIAGNOSIS — Z79899 Other long term (current) drug therapy: Secondary | ICD-10-CM | POA: Insufficient documentation

## 2013-08-04 DIAGNOSIS — Z7982 Long term (current) use of aspirin: Secondary | ICD-10-CM | POA: Insufficient documentation

## 2013-08-04 DIAGNOSIS — N400 Enlarged prostate without lower urinary tract symptoms: Secondary | ICD-10-CM | POA: Insufficient documentation

## 2013-08-04 DIAGNOSIS — F411 Generalized anxiety disorder: Secondary | ICD-10-CM | POA: Insufficient documentation

## 2013-08-04 DIAGNOSIS — E785 Hyperlipidemia, unspecified: Secondary | ICD-10-CM | POA: Insufficient documentation

## 2013-08-04 DIAGNOSIS — I10 Essential (primary) hypertension: Secondary | ICD-10-CM | POA: Insufficient documentation

## 2013-08-04 DIAGNOSIS — N201 Calculus of ureter: Secondary | ICD-10-CM | POA: Insufficient documentation

## 2013-08-04 LAB — URINALYSIS, ROUTINE W REFLEX MICROSCOPIC
Nitrite: NEGATIVE
Specific Gravity, Urine: 1.01 (ref 1.005–1.030)
Urobilinogen, UA: 0.2 mg/dL (ref 0.0–1.0)

## 2013-08-04 LAB — URINE MICROSCOPIC-ADD ON

## 2013-08-04 MED ORDER — OXYCODONE-ACETAMINOPHEN 5-325 MG PO TABS: 1.0000 | ORAL_TABLET | Freq: Four times a day (QID) | ORAL | Status: DC | PRN

## 2013-08-04 MED ORDER — ONDANSETRON 8 MG PO TBDP: 8.0000 mg | ORAL_TABLET | Freq: Three times a day (TID) | ORAL | Status: DC | PRN

## 2013-08-04 NOTE — ED Notes (Signed)
Pain just below left rib cage/LUQ area. States constant pain to one area since yesterday. NAD. No other symptoms other than the pain. Pt states he wants to make sure it isn't coming from kidney stones. States he has not had this same pain with kideny stones in the past.

## 2013-08-04 NOTE — ED Provider Notes (Signed)
CSN: [id number]     Arrival date & time 08/04/13  1316 History   First MD Initiated Contact with Patient 08/04/13 1455   Scribed for No att. providers found, the patient was seen in room [facility]. This chart was scribed by Lewanda Rife, ED scribe. Patient's care was started at 5:10 PM   Chief Complaint  Patient presents with  . Abdominal Pain   (Consider location/radiation/quality/duration/timing/severity/associated sxs/prior Treatment) The history is provided by the patient. No language interpreter was used.   HPI Comments: Jacob Fleming is a 72 y.o. male who presents to the Emergency Department complaining of constant worsening left sided abdominal pain onset yesterday morning. Describes pain as intense. Reports associated urinary frequency. Denies any aggravating and alleviating factors. Denies associated recent trauma, shortness of breath, change in appetite, hematuria, fever, and dysuria. Reports taking calcium citrate with no relief of symptoms. 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  Past Medical History  Diagnosis Date  . Hyperlipidemia   . Hypertension   . Anxiety   . BPH (benign prostatic hyperplasia)   . History of kidney stones    Past Surgical History  Procedure Laterality Date  . Tonsillectomy    . Appendectomy    . Back surgery      L5-L6  . Cystoscopy w/ retrogrades      pyelogram  . Holmium laser application N/A 05/09/2013    Procedure: HOLMIUM LASER APPLICATION;  Surgeon: Ky Barban, MD;  Location: [facility];  Service: Urology;  Laterality: N/A;  . Cystoscopy/retrograde/ureteroscopy/stone extraction with basket Left 05/09/2013    Procedure: CYSTOSCOPY/LEFT RETROGRADE/URETEROSCOPY/STONE EXTRACTION WITH BASKET;  Surgeon: Ky Barban, MD;  Location: [facility];  Service: Urology;  Laterality: Left;  . Lithotripsy N/A 05/09/2013    Procedure: LITHOTRIPSY;  Surgeon: Ky Barban, MD;  Location: [facility];  Service: Urology;  Laterality: N/A;    Family History  Problem Relation Age of Onset  . Diabetes Brother   . Diabetes Sister   . Dementia Mother   . Other Father     perforated intestine  . Coronary artery disease    . Diabetes    . Hypertension     History  Substance Use Topics  . Smoking status: Never Smoker   . Smokeless tobacco: Never Used  . Alcohol Use: No    Review of Systems  Constitutional: Negative for fever.  Gastrointestinal: Positive for abdominal pain.  All other systems reviewed and are negative.   A complete 10 system review of systems was obtained and all systems are negative except as noted in the HPI and PMHx.    Allergies  Review of patient's allergies indicates no known allergies.  Home Medications   Current Outpatient Rx  Name  Route  Sig  Dispense  Refill  . amLODipine (NORVASC) 2.5 MG tablet   Oral   Take 2.5 mg by mouth daily.          Marland Kitchen aspirin 81 MG chewable tablet   Oral   Chew 81 mg by mouth daily.         Marland Kitchen b complex vitamins tablet   Oral   Take 1 tablet by mouth daily.         Marland Kitchen BIOTIN PO   Oral   Take by mouth daily.         . chlorthalidone (HYGROTON) 25 MG tablet   Oral   Take 25 mg by mouth daily.         Marland Kitchen  Cholecalciferol (VITAMIN D3) 1000 UNITS CAPS   Oral   Take 1,000 Units by mouth daily.          Marland Kitchen lisinopril (PRINIVIL,ZESTRIL) 20 MG tablet   Oral   Take 20 mg by mouth daily.           Marland Kitchen LORazepam (ATIVAN) 0.5 MG tablet   Oral   Take 0.5 mg by mouth every 8 (eight) hours as needed for anxiety.          . methylcellulose (ARTIFICIAL TEARS) 1 % ophthalmic solution   Both Eyes   Place 1 drop into both eyes 2 (two) times daily as needed (dry eyes).         . multivitamin (THERAGRAN) per tablet   Oral   Take 1 tablet by mouth daily.           . phosphorus (K PHOS NEUTRAL) 155-852-130 MG tablet   Oral   Take 1 tablet by mouth daily.           . potassium chloride (K-DUR) 10 MEQ tablet   Oral   Take 10 mEq by mouth  daily.         Marland Kitchen pyridOXINE (VITAMIN B-6) 100 MG tablet   Oral   Take 100 mg by mouth daily.         . simvastatin (ZOCOR) 20 MG tablet   Oral   Take 20 mg by mouth at bedtime.           . Tamsulosin HCl (FLOMAX) 0.4 MG CAPS   Oral   Take 0.4 mg by mouth daily.           Marland Kitchen triamcinolone (KENALOG) 0.025 % cream   Topical   Apply topically as needed.          . ondansetron (ZOFRAN) 8 MG tablet   Oral   Take 1 tablet (8 mg total) by mouth every 12 (twelve) hours as needed for nausea.   8 tablet   0   . ondansetron (ZOFRAN-ODT) 8 MG disintegrating tablet   Oral   Take 1 tablet (8 mg total) by mouth every 8 (eight) hours as needed for nausea.   20 tablet   0   . oxyCODONE-acetaminophen (PERCOCET/ROXICET) 5-325 MG per tablet   Oral   Take 1-2 tablets by mouth every 6 (six) hours as needed for pain.   20 tablet   0    BP 146/64  Pulse 71  Temp(Src) 98 F (36.7 C) (Oral)  Resp 20  Ht 5\' 10"  (1.778 m)  Wt 135 lb (61.236 kg)  BMI 19.37 kg/m2  SpO2 100% Physical Exam  Nursing note and vitals reviewed. Constitutional: He is oriented to person, place, and time. He appears well-developed and well-nourished. No distress.  HENT:  Head: Normocephalic and atraumatic.  Eyes: Conjunctivae and EOM are normal.  Neck: Neck supple. No tracheal deviation present.  Cardiovascular: Normal rate and regular rhythm.   No murmur heard. Pulmonary/Chest: Effort normal and breath sounds normal. No respiratory distress.  Abdominal: Soft. He exhibits no mass. There is tenderness in the left upper quadrant.  CVA tenderness on the left  No hernias, no masses palpated   Musculoskeletal: Normal range of motion.  Neurological: He is alert and oriented to person, place, and time.  Skin: Skin is warm and dry. No rash noted.  Psychiatric: He has a normal mood and affect. His behavior is normal.    ED Course  Procedures (including critical care time)  COORDINATION OF CARE:  Nursing  notes reviewed. Vital signs reviewed. Initial pt interview and examination performed.   5:10 PM-Discussed work up plan with pt at bedside, which includes abdominal US . Pt agrees with plan.   Treatment plan initiated:Medications - No data to display   Initial diagnostic testing ordered.    Labs Review Labs Reviewed  URINALYSIS, ROUTINE W REFLEX MICROSCOPIC - Abnormal; Notable for the following:    APPearance CLOUDY (*)    Hgb urine dipstick LARGE (*)    Leukocytes, UA SMALL (*)    All other components within normal limits  URINE MICROSCOPIC-ADD ON - Abnormal; Notable for the following:    Bacteria, UA FEW (*)    All other components within normal limits  URINE CULTURE   Imaging Review No results found.  EKG Interpretation   None       MDM   1. Ureteral stone    Patient with left upper abdominal/flank pain. History is history kidney stones. CT scan done a month ago showed some residual stones this portion kidney. Urinalysis shows hematuria. No clear infection. He is not previously had renal failure. Pain is controlled. Will discharge home with pain medicines as needed and will followup with urology. Urine culture has been sent.  I personally performed the services described in this documentation, which was scribed in my presence. The recorded information has been reviewed and considered.    Juliet Rude. Rubin Payor, MD 08/04/13 1711

## 2013-08-05 LAB — URINE CULTURE
Colony Count: NO GROWTH
Culture: NO GROWTH

## 2013-08-29 ENCOUNTER — Ambulatory Visit (INDEPENDENT_AMBULATORY_CARE_PROVIDER_SITE_OTHER): Payer: Medicare Other | Admitting: Cardiology

## 2013-08-29 ENCOUNTER — Encounter: Payer: Self-pay | Admitting: Cardiology

## 2013-08-29 VITALS — BP 152/74 | HR 68 | Ht 70.5 in | Wt 146.0 lb

## 2013-08-29 DIAGNOSIS — I10 Essential (primary) hypertension: Secondary | ICD-10-CM

## 2013-08-29 NOTE — Patient Instructions (Addendum)
Your physician recommends that you schedule a follow-up appointment in: YEAR

## 2013-08-29 NOTE — Progress Notes (Addendum)
Clinical Summary Mr. Rickey is a 72 y.o.male former patient of Dr Daleen Squibb seen today for follow up of the following medical problems  1. HTN - checks bp occasionally. Unsure of numbers - compliant with meds, has not taken today.  2. Hyperlipidemia - compliant with zocor - no recent panel in our system - 12/2011 TC 160 HDL 53 TG 75 LDL 92   Past Medical History  Diagnosis Date  . Hyperlipidemia   . Hypertension   . Anxiety   . BPH (benign prostatic hyperplasia)   . History of kidney stones      No Known Allergies   Current Outpatient Prescriptions  Medication Sig Dispense Refill  . amLODipine (NORVASC) 2.5 MG tablet Take 2.5 mg by mouth daily.       Marland Kitchen aspirin 81 MG chewable tablet Chew 81 mg by mouth daily.      Marland Kitchen b complex vitamins tablet Take 1 tablet by mouth daily.      Marland Kitchen BIOTIN PO Take by mouth daily.      . chlorthalidone (HYGROTON) 25 MG tablet Take 25 mg by mouth daily.      . Cholecalciferol (VITAMIN D3) 1000 UNITS CAPS Take 1,000 Units by mouth daily.       Marland Kitchen lisinopril (PRINIVIL,ZESTRIL) 20 MG tablet Take 20 mg by mouth daily.        Marland Kitchen LORazepam (ATIVAN) 0.5 MG tablet Take 0.5 mg by mouth every 8 (eight) hours as needed for anxiety.       . methylcellulose (ARTIFICIAL TEARS) 1 % ophthalmic solution Place 1 drop into both eyes 2 (two) times daily as needed (dry eyes).      . multivitamin (THERAGRAN) per tablet Take 1 tablet by mouth daily.        . ondansetron (ZOFRAN) 8 MG tablet Take 1 tablet (8 mg total) by mouth every 12 (twelve) hours as needed for nausea.  8 tablet  0  . ondansetron (ZOFRAN-ODT) 8 MG disintegrating tablet Take 1 tablet (8 mg total) by mouth every 8 (eight) hours as needed for nausea.  20 tablet  0  . oxyCODONE-acetaminophen (PERCOCET/ROXICET) 5-325 MG per tablet Take 1-2 tablets by mouth every 6 (six) hours as needed for pain.  20 tablet  0  . phosphorus (K PHOS NEUTRAL) 155-852-130 MG tablet Take 1 tablet by mouth daily.        . potassium  chloride (K-DUR) 10 MEQ tablet Take 10 mEq by mouth daily.      Marland Kitchen pyridOXINE (VITAMIN B-6) 100 MG tablet Take 100 mg by mouth daily.      . simvastatin (ZOCOR) 20 MG tablet Take 20 mg by mouth at bedtime.        . Tamsulosin HCl (FLOMAX) 0.4 MG CAPS Take 0.4 mg by mouth daily.        Marland Kitchen triamcinolone (KENALOG) 0.025 % cream Apply topically as needed.        No current facility-administered medications for this visit.     Past Surgical History  Procedure Laterality Date  . Tonsillectomy    . Appendectomy    . Back surgery      L5-L6  . Cystoscopy w/ retrogrades      pyelogram  . Holmium laser application N/A 05/09/2013    Procedure: HOLMIUM LASER APPLICATION;  Surgeon: Ky Barban, MD;  Location: [facility];  Service: Urology;  Laterality: N/A;  . Cystoscopy/retrograde/ureteroscopy/stone extraction with basket Left 05/09/2013    Procedure: CYSTOSCOPY/LEFT RETROGRADE/URETEROSCOPY/STONE EXTRACTION  WITH BASKET;  Surgeon: Ky Barban, MD;  Location: [facility];  Service: Urology;  Laterality: Left;  . Lithotripsy N/A 05/09/2013    Procedure: LITHOTRIPSY;  Surgeon: Ky Barban, MD;  Location: [facility];  Service: Urology;  Laterality: N/A;     No Known Allergies    Family History  Problem Relation Age of Onset  . Diabetes Brother   . Diabetes Sister   . Dementia Mother   . Other Father     perforated intestine  . Coronary artery disease    . Diabetes    . Hypertension       Social History Mr. Branden reports that he has never smoked. He has never used smokeless tobacco. Mr. Stettler reports that he does not drink alcohol.   Review of Systems CONSTITUTIONAL: No weight loss, fever, chills, weakness or fatigue.  HEENT: Eyes: No visual loss, blurred vision, double vision or yellow sclerae.No hearing loss, sneezing, congestion, runny nose or sore throat.  SKIN: No rash or itching.  CARDIOVASCULAR: per HPI RESPIRATORY: per HPI  GASTROINTESTINAL: No anorexia, nausea, vomiting or  diarrhea. No abdominal pain or blood.  GENITOURINARY: No burning on urination, no polyuria NEUROLOGICAL: No headache, dizziness, syncope, paralysis, ataxia, numbness or tingling in the extremities. No change in bowel or bladder control.  MUSCULOSKELETAL: No muscle, back pain, joint pain or stiffness.  LYMPHATICS: No enlarged nodes. No history of splenectomy.  PSYCHIATRIC: No history of depression or anxiety.  ENDOCRINOLOGIC: No reports of sweating, cold or heat intolerance. No polyuria or polydipsia.  Marland Kitchen   Physical Examination p 68 bp 150/70 Wt 146 lbs BMI 21 Gen: resting comfortably, no acute distress HEENT: no scleral icterus, pupils equal round and reactive, no palptable cervical adenopathy,  CV: RRR, no m/r/g,no JVD, no carotid bruits Resp: Clear to auscultation bilaterally GI: abdomen is soft, non-tender, non-distended, normal bowel sounds, no hepatosplenomegaly MSK: extremities are warm, no edema.  Skin: warm, no rash Neuro:  no focal deficits Psych: appropriate affect  08/29/13 Clinic EKG Sinus rhythm, short run of atach, poor quality tracing  Assessment and Plan  1. HTN - elevated in clinic today, he has not taken his medications yet today - I asked him to keep a bp log and bring with him to his upcoming PCP appointment - continue current meds for now  2. Hyperlipidemia - at goal, continue current statin - he is due to get annual lab work with his PCP, will follow up results      Antoine Poche, M.D., F.A.C.C.

## 2013-09-13 ENCOUNTER — Other Ambulatory Visit: Payer: Self-pay | Admitting: Neurology

## 2013-09-25 ENCOUNTER — Ambulatory Visit (HOSPITAL_COMMUNITY)
Admission: RE | Admit: 2013-09-25 | Discharge: 2013-09-25 | Disposition: A | Discharge status: Home / Self Care | Payer: Medicare Other | Source: Ambulatory Visit | Attending: Neurology | Admitting: Neurology

## 2013-09-25 DIAGNOSIS — I10 Essential (primary) hypertension: Secondary | ICD-10-CM | POA: Insufficient documentation

## 2013-09-25 DIAGNOSIS — R5381 Other malaise: Secondary | ICD-10-CM | POA: Insufficient documentation

## 2013-09-25 DIAGNOSIS — R51 Headache: Secondary | ICD-10-CM | POA: Insufficient documentation

## 2013-09-25 DIAGNOSIS — I6789 Other cerebrovascular disease: Secondary | ICD-10-CM | POA: Insufficient documentation

## 2014-07-30 ENCOUNTER — Emergency Department (HOSPITAL_COMMUNITY): Payer: Medicare Other

## 2014-07-30 ENCOUNTER — Emergency Department (HOSPITAL_COMMUNITY)
Admission: EM | Admit: 2014-07-30 | Discharge: 2014-07-31 | Disposition: A | Discharge status: Home / Self Care | Payer: Medicare Other | Attending: Emergency Medicine | Admitting: Emergency Medicine

## 2014-07-30 ENCOUNTER — Encounter (HOSPITAL_COMMUNITY): Payer: Self-pay | Admitting: Emergency Medicine

## 2014-07-30 DIAGNOSIS — N200 Calculus of kidney: Secondary | ICD-10-CM | POA: Insufficient documentation

## 2014-07-30 DIAGNOSIS — Z79899 Other long term (current) drug therapy: Secondary | ICD-10-CM | POA: Diagnosis not present

## 2014-07-30 DIAGNOSIS — N390 Urinary tract infection, site not specified: Secondary | ICD-10-CM | POA: Diagnosis not present

## 2014-07-30 DIAGNOSIS — Z7982 Long term (current) use of aspirin: Secondary | ICD-10-CM | POA: Insufficient documentation

## 2014-07-30 DIAGNOSIS — F419 Anxiety disorder, unspecified: Secondary | ICD-10-CM | POA: Diagnosis not present

## 2014-07-30 DIAGNOSIS — Z9889 Other specified postprocedural states: Secondary | ICD-10-CM | POA: Insufficient documentation

## 2014-07-30 DIAGNOSIS — Z9089 Acquired absence of other organs: Secondary | ICD-10-CM | POA: Insufficient documentation

## 2014-07-30 DIAGNOSIS — E785 Hyperlipidemia, unspecified: Secondary | ICD-10-CM | POA: Diagnosis not present

## 2014-07-30 DIAGNOSIS — Z7952 Long term (current) use of systemic steroids: Secondary | ICD-10-CM | POA: Diagnosis not present

## 2014-07-30 DIAGNOSIS — R109 Unspecified abdominal pain: Secondary | ICD-10-CM | POA: Diagnosis present

## 2014-07-30 LAB — CBC WITH DIFFERENTIAL/PLATELET
BASOS ABS: 0 10*3/uL (ref 0.0–0.1)
Basophils Relative: 0 % (ref 0–1)
EOS ABS: 0 10*3/uL (ref 0.0–0.7)
EOS PCT: 0 % (ref 0–5)
HCT: 37.8 % — ABNORMAL LOW (ref 39.0–52.0)
Hemoglobin: 13.1 g/dL (ref 13.0–17.0)
LYMPHS PCT: 4 % — AB (ref 12–46)
Lymphs Abs: 0.5 10*3/uL — ABNORMAL LOW (ref 0.7–4.0)
MCH: 32.7 pg (ref 26.0–34.0)
MCHC: 34.7 g/dL (ref 30.0–36.0)
MCV: 94.3 fL (ref 78.0–100.0)
MONO ABS: 1 10*3/uL (ref 0.1–1.0)
Monocytes Relative: 7 % (ref 3–12)
Neutro Abs: 13.5 10*3/uL — ABNORMAL HIGH (ref 1.7–7.7)
Neutrophils Relative %: 89 % — ABNORMAL HIGH (ref 43–77)
Platelets: 190 10*3/uL (ref 150–400)
RBC: 4.01 MIL/uL — ABNORMAL LOW (ref 4.22–5.81)
RDW: 12.7 % (ref 11.5–15.5)
WBC: 15 10*3/uL — AB (ref 4.0–10.5)

## 2014-07-30 LAB — URINALYSIS, ROUTINE W REFLEX MICROSCOPIC
Bilirubin Urine: NEGATIVE
GLUCOSE, UA: NEGATIVE mg/dL
HGB URINE DIPSTICK: NEGATIVE
Nitrite: NEGATIVE
PROTEIN: NEGATIVE mg/dL
Specific Gravity, Urine: 1.015 (ref 1.005–1.030)
UROBILINOGEN UA: 0.2 mg/dL (ref 0.0–1.0)
pH: 6.5 (ref 5.0–8.0)

## 2014-07-30 LAB — COMPREHENSIVE METABOLIC PANEL
ALT: 16 U/L (ref 0–53)
AST: 29 U/L (ref 0–37)
Albumin: 4 g/dL (ref 3.5–5.2)
Alkaline Phosphatase: 65 U/L (ref 39–117)
Anion gap: 11 (ref 5–15)
BUN: 26 mg/dL — ABNORMAL HIGH (ref 6–23)
CALCIUM: 10.2 mg/dL (ref 8.4–10.5)
CO2: 29 meq/L (ref 19–32)
CREATININE: 1.66 mg/dL — AB (ref 0.50–1.35)
Chloride: 97 mEq/L (ref 96–112)
GFR calc Af Amer: 45 mL/min — ABNORMAL LOW (ref >90)
GFR, EST NON AFRICAN AMERICAN: 39 mL/min — AB (ref >90)
Glucose, Bld: 143 mg/dL — ABNORMAL HIGH (ref 70–99)
Potassium: 4.2 mEq/L (ref 3.7–5.3)
SODIUM: 137 meq/L (ref 137–147)
TOTAL PROTEIN: 7.5 g/dL (ref 6.0–8.3)
Total Bilirubin: 0.8 mg/dL (ref 0.3–1.2)

## 2014-07-30 LAB — URINE MICROSCOPIC-ADD ON

## 2014-07-30 MED ORDER — HYDROMORPHONE HCL 1 MG/ML IJ SOLN
0.5000 mg | Freq: Once | INTRAMUSCULAR | Status: AC
  Administered 2014-07-30: 0.5 mg via INTRAVENOUS
  Filled 2014-07-30: qty 1

## 2014-07-30 MED ORDER — ONDANSETRON HCL 4 MG/2ML IJ SOLN
4.0000 mg | Freq: Once | INTRAMUSCULAR | Status: AC
  Administered 2014-07-30: 4 mg via INTRAVENOUS
  Filled 2014-07-30: qty 2

## 2014-07-30 NOTE — ED Provider Notes (Signed)
CSN: [id number]     Arrival date & time 07/30/14  2013 History  This chart was scribed for Jacob LennertJoseph L Taheerah Guldin, MD by Bronson CurbJacqueline Melvin, ED Scribe. This patient was seen in room APA07/APA07 and the patient's care was started at 10:28 PM.    Chief Complaint  Patient presents with  . Nephrolithiasis      Patient is a 73 y.o. male presenting with abdominal pain. The history is provided by the patient. No language interpreter was used.  Abdominal Pain Pain location: lower. Pain radiates to:  L flank and R flank Pain severity:  Moderate Timing:  Intermittent Progression:  Worsening Relieved by:  None tried Worsened by:  Nothing tried Ineffective treatments:  None tried Associated symptoms: no chest pain, no cough, no diarrhea, no fatigue and no hematuria     HPI Comments: Jacob Fleming is a 73 y.o. male, with history of HTN, HLD, and BPH, who presents to the Emergency Department complaining of gradually worsening, intermittent, lower abdominal pain onset PTA. Patient reports the pain radiates to both sides of his back. Patient has history of kidney stones and states this feels similar. Patient denies history of diverticulitis, and reports past surgical history of an appendectomy.   Past Medical History  Diagnosis Date  . Hyperlipidemia   . Hypertension   . Anxiety   . BPH (benign prostatic hyperplasia)   . History of kidney stones    Past Surgical History  Procedure Laterality Date  . Tonsillectomy    . Appendectomy    . Back surgery      L5-L6  . Cystoscopy w/ retrogrades      pyelogram  . Holmium laser application N/A 05/09/2013    Procedure: HOLMIUM LASER APPLICATION;  Surgeon: Ky BarbanMohammad I Javaid, MD;  Location: [facility];  Service: Urology;  Laterality: N/A;  . Cystoscopy/retrograde/ureteroscopy/stone extraction with basket Left 05/09/2013    Procedure: CYSTOSCOPY/LEFT RETROGRADE/URETEROSCOPY/STONE EXTRACTION WITH BASKET;  Surgeon: Ky BarbanMohammad I Javaid, MD;  Location: [facility];   Service: Urology;  Laterality: Left;  . Lithotripsy N/A 05/09/2013    Procedure: LITHOTRIPSY;  Surgeon: Ky BarbanMohammad I Javaid, MD;  Location: [facility];  Service: Urology;  Laterality: N/A;   Family History  Problem Relation Age of Onset  . Diabetes Brother   . Diabetes Sister   . Dementia Mother   . Other Father     perforated intestine  . Coronary artery disease    . Diabetes    . Hypertension     History  Substance Use Topics  . Smoking status: Never Smoker   . Smokeless tobacco: Never Used  . Alcohol Use: No    Review of Systems  Constitutional: Negative for appetite change and fatigue.  HENT: Negative for congestion, ear discharge and sinus pressure.   Eyes: Negative for discharge.  Respiratory: Negative for cough.   Cardiovascular: Negative for chest pain.  Gastrointestinal: Positive for abdominal pain (lower). Negative for diarrhea.  Genitourinary: Positive for flank pain. Negative for frequency and hematuria.  Musculoskeletal: Negative for back pain.  Skin: Negative for rash.  Neurological: Negative for seizures and headaches.  Psychiatric/Behavioral: Negative for hallucinations.      Allergies  Review of patient's allergies indicates no known allergies.  Home Medications   Prior to Admission medications   Medication Sig Start Date End Date Taking? Authorizing Provider  amLODipine (NORVASC) 2.5 MG tablet Take 2.5 mg by mouth daily.     Historical Provider, MD  aspirin 81 MG chewable tablet Chew 81 mg  by mouth daily.    Historical Provider, MD  betamethasone dipropionate (DIPROLENE) 0.05 % cream Apply 1 application topically 2 (two) times daily.    Historical Provider, MD  cetirizine (ZYRTEC) 10 MG chewable tablet Chew 10 mg by mouth daily.    Historical Provider, MD  Cholecalciferol (VITAMIN D3) 1000 UNITS CAPS Take 1,000 Units by mouth daily.     Historical Provider, MD  lisinopril (PRINIVIL,ZESTRIL) 20 MG tablet Take 20 mg by mouth daily.      Historical Provider,  MD  LORazepam (ATIVAN) 0.5 MG tablet Take 0.5 mg by mouth every 8 (eight) hours as needed for anxiety.     Historical Provider, MD  methylcellulose (ARTIFICIAL TEARS) 1 % ophthalmic solution Place 1 drop into both eyes 2 (two) times daily as needed (dry eyes).    Historical Provider, MD  multivitamin [facility]) per tablet Take 1 tablet by mouth daily.      Historical Provider, MD  ondansetron (ZOFRAN-ODT) 8 MG disintegrating tablet Take 1 tablet (8 mg total) by mouth every 8 (eight) hours as needed for nausea. 08/04/13   Juliet Rude. Pickering, MD  oxyCODONE-acetaminophen (PERCOCET/ROXICET) 5-325 MG per tablet Take 1-2 tablets by mouth every 6 (six) hours as needed for pain. 08/04/13   Juliet Rude. Pickering, MD  potassium chloride (K-DUR) 10 MEQ tablet Take 10 mEq by mouth daily.    Historical Provider, MD  pyridOXINE (VITAMIN B-6) 100 MG tablet Take 100 mg by mouth daily.    Historical Provider, MD  simvastatin (ZOCOR) 20 MG tablet Take 20 mg by mouth at bedtime.      Historical Provider, MD  Tamsulosin HCl (FLOMAX) 0.4 MG CAPS Take 0.4 mg by mouth daily.      Historical Provider, MD  triamcinolone (KENALOG) 0.025 % cream Apply topically as needed.     Historical Provider, MD  zolpidem (AMBIEN) 10 MG tablet Take 10 mg by mouth at bedtime as needed for sleep.    Historical Provider, MD   Triage Vitals: BP 137/62  Pulse 62  Temp(Src) 98.1 F (36.7 C) (Oral)  Resp 16  Ht 5\' 10"  (1.778 m)  Wt 145 lb (65.772 kg)  BMI 20.81 kg/m2  SpO2 96%  Physical Exam  Constitutional: He is oriented to person, place, and time. He appears well-developed.  HENT:  Head: Normocephalic.  Eyes: Conjunctivae and EOM are normal. No scleral icterus.  Neck: Neck supple. No thyromegaly present.  Cardiovascular: Normal rate and regular rhythm.  Exam reveals no gallop and no friction rub.   No murmur heard. Pulmonary/Chest: No stridor. He has no wheezes. He has no rales. He exhibits no tenderness.  Abdominal: He  exhibits no distension. There is tenderness in the right lower quadrant. There is no rebound.  Moderate RLQ tenderness  Musculoskeletal: Normal range of motion. He exhibits no edema.  Lymphadenopathy:    He has no cervical adenopathy.  Neurological: He is oriented to person, place, and time. He exhibits normal muscle tone. Coordination normal.  Skin: No rash noted. No erythema.  Psychiatric: He has a normal mood and affect. His behavior is normal.    ED Course  Procedures (including critical care time)  DIAGNOSTIC STUDIES: Oxygen Saturation is 96% on room air, adequate by my interpretation.    COORDINATION OF CARE: At 2233 Discussed treatment plan with patient which includes pain medication, imaging and labs. Patient agrees.   Labs Review Labs Reviewed  URINALYSIS, ROUTINE W REFLEX MICROSCOPIC    Imaging Review No results found.  EKG Interpretation None      MDM   Final diagnoses:  None    Large kidney stone.   Pt will follow up with his urologist this week.  The chart was scribed for me under my direct supervision.  I personally performed the history, physical, and medical decision making and all procedures in the evaluation of this patient.Jacob Lennert, MD 07/31/14 [phone]

## 2014-07-30 NOTE — ED Notes (Signed)
I am having problems with kidney stones per pt. Hurting in both sides of my back an in my abdomen per pt.

## 2014-07-31 DIAGNOSIS — N390 Urinary tract infection, site not specified: Secondary | ICD-10-CM | POA: Diagnosis not present

## 2014-07-31 MED ORDER — ONDANSETRON 4 MG PO TBDP: ORAL_TABLET | ORAL | Status: DC

## 2014-07-31 MED ORDER — OXYCODONE-ACETAMINOPHEN 5-325 MG PO TABS: 1.0000 | ORAL_TABLET | Freq: Four times a day (QID) | ORAL | Status: DC | PRN

## 2014-07-31 MED ORDER — CEPHALEXIN 500 MG PO CAPS: 500.0000 mg | ORAL_CAPSULE | Freq: Three times a day (TID) | ORAL | Status: DC

## 2014-07-31 MED ORDER — DEXTROSE 5 % IV SOLN
1.0000 g | Freq: Once | INTRAVENOUS | Status: AC
  Administered 2014-07-31: 1 g via INTRAVENOUS
  Filled 2014-07-31: qty 10

## 2014-07-31 NOTE — Discharge Instructions (Signed)
Follow up with your urologist this week.  Call tomorrow for an appointment.   If they will not see you then you can call [facility] in [place] and [place]

## 2014-07-31 NOTE — ED Notes (Signed)
Dr Zammit at bedside. 

## 2014-08-06 ENCOUNTER — Emergency Department (HOSPITAL_COMMUNITY)
Admission: EM | Admit: 2014-08-06 | Discharge: 2014-08-06 | Disposition: A | Discharge status: Home / Self Care | Payer: Medicare Other | Attending: Emergency Medicine | Admitting: Emergency Medicine

## 2014-08-06 ENCOUNTER — Emergency Department (HOSPITAL_COMMUNITY): Payer: Medicare Other

## 2014-08-06 ENCOUNTER — Encounter (HOSPITAL_COMMUNITY): Payer: Self-pay | Admitting: Emergency Medicine

## 2014-08-06 DIAGNOSIS — Z9889 Other specified postprocedural states: Secondary | ICD-10-CM | POA: Diagnosis not present

## 2014-08-06 DIAGNOSIS — Z87442 Personal history of urinary calculi: Secondary | ICD-10-CM | POA: Insufficient documentation

## 2014-08-06 DIAGNOSIS — F419 Anxiety disorder, unspecified: Secondary | ICD-10-CM | POA: Diagnosis not present

## 2014-08-06 DIAGNOSIS — E785 Hyperlipidemia, unspecified: Secondary | ICD-10-CM | POA: Insufficient documentation

## 2014-08-06 DIAGNOSIS — I10 Essential (primary) hypertension: Secondary | ICD-10-CM | POA: Insufficient documentation

## 2014-08-06 DIAGNOSIS — R109 Unspecified abdominal pain: Secondary | ICD-10-CM | POA: Diagnosis present

## 2014-08-06 DIAGNOSIS — Z87448 Personal history of other diseases of urinary system: Secondary | ICD-10-CM | POA: Diagnosis not present

## 2014-08-06 DIAGNOSIS — R1031 Right lower quadrant pain: Secondary | ICD-10-CM | POA: Diagnosis not present

## 2014-08-06 DIAGNOSIS — Z9089 Acquired absence of other organs: Secondary | ICD-10-CM | POA: Diagnosis not present

## 2014-08-06 DIAGNOSIS — N23 Unspecified renal colic: Secondary | ICD-10-CM

## 2014-08-06 LAB — URINALYSIS, ROUTINE W REFLEX MICROSCOPIC
Bilirubin Urine: NEGATIVE
GLUCOSE, UA: NEGATIVE mg/dL
Hgb urine dipstick: NEGATIVE
KETONES UR: NEGATIVE mg/dL
Leukocytes, UA: NEGATIVE
Nitrite: NEGATIVE
PROTEIN: NEGATIVE mg/dL
Specific Gravity, Urine: 1.01 (ref 1.005–1.030)
UROBILINOGEN UA: 0.2 mg/dL (ref 0.0–1.0)
pH: 7.5 (ref 5.0–8.0)

## 2014-08-06 MED ORDER — HYDROMORPHONE HCL 1 MG/ML IJ SOLN
1.0000 mg | Freq: Once | INTRAMUSCULAR | Status: AC
  Administered 2014-08-06: 1 mg via INTRAVENOUS
  Filled 2014-08-06: qty 1

## 2014-08-06 MED ORDER — OXYCODONE-ACETAMINOPHEN 5-325 MG PO TABS: 1.0000 | ORAL_TABLET | ORAL | Status: DC | PRN

## 2014-08-06 MED ORDER — HYDROMORPHONE HCL 1 MG/ML IJ SOLN
0.5000 mg | Freq: Once | INTRAMUSCULAR | Status: AC
  Administered 2014-08-06: 0.5 mg via INTRAVENOUS

## 2014-08-06 MED ORDER — ONDANSETRON HCL 4 MG/2ML IJ SOLN: 4.0000 mg | Freq: Once | INTRAMUSCULAR | Status: DC

## 2014-08-06 MED ORDER — HYDROMORPHONE HCL 1 MG/ML IJ SOLN
1.0000 mg | Freq: Once | INTRAMUSCULAR | Status: DC
  Filled 2014-08-06: qty 1

## 2014-08-06 MED ORDER — ONDANSETRON HCL 4 MG/2ML IJ SOLN
4.0000 mg | Freq: Once | INTRAMUSCULAR | Status: AC
  Administered 2014-08-06: 4 mg via INTRAVENOUS
  Filled 2014-08-06: qty 2

## 2014-08-06 NOTE — ED Notes (Signed)
Pt c/o bilateral flank pain that is radiating around to his abdomen; pt has hx of kidney stones; pt states he took 2 tramadol pta and has not had any relief

## 2014-08-06 NOTE — ED Provider Notes (Addendum)
CSN: [id number]     Arrival date & time 08/06/14  0216 History   First MD Initiated Contact with Patient 08/06/14 0328     Chief Complaint  Patient presents with  . Flank Pain     (Consider location/radiation/quality/duration/timing/severity/associated sxs/prior Treatment) HPI This is a 73 year old male with a history of kidney stones and a deformity of the right kidney. He is here with right suprapubic pain that began after going to bed yesterday evening. He rates his pain at 7-8/10 at its worst. He denies associated nausea. He denies hematuria. The pain is somewhat worse with movement or palpation. He characterizes the pain as like prior kidney stones.  Past Medical History  Diagnosis Date  . Hyperlipidemia   . Hypertension   . Anxiety   . BPH (benign prostatic hyperplasia)   . History of kidney stones    Past Surgical History  Procedure Laterality Date  . Tonsillectomy    . Appendectomy    . Back surgery      L5-L6  . Cystoscopy w/ retrogrades      pyelogram  . Holmium laser application N/A 05/09/2013    Procedure: HOLMIUM LASER APPLICATION;  Surgeon: Ky Barban, MD;  Location: [facility];  Service: Urology;  Laterality: N/A;  . Cystoscopy/retrograde/ureteroscopy/stone extraction with basket Left 05/09/2013    Procedure: CYSTOSCOPY/LEFT RETROGRADE/URETEROSCOPY/STONE EXTRACTION WITH BASKET;  Surgeon: Ky Barban, MD;  Location: [facility];  Service: Urology;  Laterality: Left;  . Lithotripsy N/A 05/09/2013    Procedure: LITHOTRIPSY;  Surgeon: Ky Barban, MD;  Location: [facility];  Service: Urology;  Laterality: N/A;   Family History  Problem Relation Age of Onset  . Diabetes Brother   . Diabetes Sister   . Dementia Mother   . Other Father     perforated intestine  . Coronary artery disease    . Diabetes    . Hypertension     History  Substance Use Topics  . Smoking status: Never Smoker   . Smokeless tobacco: Never Used  . Alcohol Use: No    Review of  Systems  All other systems reviewed and are negative.   Allergies  Review of patient's allergies indicates no known allergies.  Home Medications   Prior to Admission medications   Medication Sig Start Date End Date Taking? Authorizing Provider  amLODipine (NORVASC) 2.5 MG tablet Take 2.5 mg by mouth daily.     Historical Provider, MD  aspirin 81 MG chewable tablet Chew 81 mg by mouth daily.    Historical Provider, MD  betamethasone dipropionate (DIPROLENE) 0.05 % cream Apply 1 application topically 2 (two) times daily.    Historical Provider, MD  cephALEXin (KEFLEX) 500 MG capsule Take 1 capsule (500 mg total) by mouth 3 (three) times daily. 07/31/14   Benny Lennert, MD  cephALEXin (KEFLEX) 500 MG capsule Take 1 capsule (500 mg total) by mouth 3 (three) times daily. 07/31/14   Benny Lennert, MD  cetirizine (ZYRTEC) 10 MG chewable tablet Chew 10 mg by mouth daily.    Historical Provider, MD  Cholecalciferol (VITAMIN D3) 1000 UNITS CAPS Take 1,000 Units by mouth daily.     Historical Provider, MD  lisinopril (PRINIVIL,ZESTRIL) 20 MG tablet Take 20 mg by mouth daily.      Historical Provider, MD  LORazepam (ATIVAN) 0.5 MG tablet Take 0.5 mg by mouth every 8 (eight) hours as needed for anxiety.     Historical Provider, MD  methylcellulose (ARTIFICIAL TEARS) 1 % ophthalmic  solution Place 1 drop into both eyes 2 (two) times daily as needed (dry eyes).    Historical Provider, MD  multivitamin [facility]THERAGRAN) per tablet Take 1 tablet by mouth daily.      Historical Provider, MD  ondansetron (ZOFRAN ODT) 4 MG disintegrating tablet 4mg  ODT q4 hours prn nausea/vomit 07/31/14   Benny LennertJoseph L Zammit, MD  ondansetron (ZOFRAN ODT) 4 MG disintegrating tablet 4mg  ODT q4 hours prn nausea/vomit 07/31/14   Benny LennertJoseph L Zammit, MD  ondansetron (ZOFRAN-ODT) 8 MG disintegrating tablet Take 1 tablet (8 mg total) by mouth every 8 (eight) hours as needed for nausea. 08/04/13   Juliet RudeNathan R. Pickering, MD  oxyCODONE-acetaminophen  (PERCOCET/ROXICET) 5-325 MG per tablet Take 1-2 tablets by mouth every 6 (six) hours as needed for pain. 08/04/13   Juliet RudeNathan R. Pickering, MD  oxyCODONE-acetaminophen (PERCOCET/ROXICET) 5-325 MG per tablet Take 1 tablet by mouth every 6 (six) hours as needed. 07/31/14   Benny LennertJoseph L Zammit, MD  oxyCODONE-acetaminophen (PERCOCET/ROXICET) 5-325 MG per tablet Take 1 tablet by mouth every 6 (six) hours as needed. 07/31/14   Benny LennertJoseph L Zammit, MD  potassium chloride (K-DUR) 10 MEQ tablet Take 10 mEq by mouth daily.    Historical Provider, MD  pyridOXINE (VITAMIN B-6) 100 MG tablet Take 100 mg by mouth daily.    Historical Provider, MD  simvastatin (ZOCOR) 20 MG tablet Take 20 mg by mouth at bedtime.      Historical Provider, MD  Tamsulosin HCl (FLOMAX) 0.4 MG CAPS Take 0.4 mg by mouth daily.      Historical Provider, MD  triamcinolone (KENALOG) 0.025 % cream Apply topically as needed.     Historical Provider, MD  zolpidem (AMBIEN) 10 MG tablet Take 10 mg by mouth at bedtime as needed for sleep.    Historical Provider, MD   BP 159/85  Pulse 67  Temp(Src) 98.3 F (36.8 C) (Oral)  Resp 20  Ht 5\' 10"  (1.778 m)  Wt 140 lb (63.504 kg)  BMI 20.09 kg/m2  SpO2 98%  Physical Exam General: Well-developed, well-nourished male in no acute distress; appearance consistent with age of record HENT: normocephalic; atraumatic Eyes: pupils equal, round and reactive to light; extraocular muscles intact Neck: supple Heart: regular rate and rhythm Lungs: clear to auscultation bilaterally Abdomen: soft; nondistended; right suprapubic tenderness; no masses or hepatosplenomegaly; bowel sounds present GU: No CVA tenderness Extremities: No deformity; full range of motion; pulses normal Neurologic: Awake, alert and oriented; motor function intact in all extremities and symmetric; no facial droop Skin: Warm and dry Psychiatric: Normal mood and affect    ED Course  Procedures (including critical care time)  MDM  Nursing  notes and vitals signs, including pulse oximetry, reviewed.  Summary of this visit's results, reviewed by myself:  Labs:  Results for orders placed during the hospital encounter of 08/06/14 (from the past 24 hour(s))  URINALYSIS, ROUTINE W REFLEX MICROSCOPIC     Status: Abnormal   Collection Time    08/06/14  3:37 AM      Result Value Ref Range   Color, Urine YELLOW  YELLOW   APPearance HAZY (*) CLEAR   Specific Gravity, Urine 1.010  1.005 - 1.030   pH 7.5  5.0 - 8.0   Glucose, UA NEGATIVE  NEGATIVE mg/dL   Hgb urine dipstick NEGATIVE  NEGATIVE   Bilirubin Urine NEGATIVE  NEGATIVE   Ketones, ur NEGATIVE  NEGATIVE mg/dL   Protein, ur NEGATIVE  NEGATIVE mg/dL   Urobilinogen, UA 0.2  0.0 -  1.0 mg/dL   Nitrite NEGATIVE  NEGATIVE   Leukocytes, UA NEGATIVE  NEGATIVE    Imaging Studies: Ct Renal Stone Study  08/06/2014   CLINICAL DATA:  Right lower quadrant and suprapubic pain.  EXAM: CT ABDOMEN AND PELVIS WITHOUT CONTRAST  TECHNIQUE: Multidetector CT imaging of the abdomen and pelvis was performed following the standard protocol without IV contrast.  COMPARISON:  07/30/2014  FINDINGS: BODY WALL: Unremarkable.  LOWER CHEST: Multi focal coronary atherosclerosis.  ABDOMEN/PELVIS:  Liver: No focal abnormality.  Biliary: Calcification in the hepatic hilum could be arterial or biliary. No cholecystitis.  Pancreas: Unremarkable.  Spleen: Unremarkable.  Adrenals: Unremarkable.  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 There is stable to worsened right hydronephrosis secondary to a 14 mm stone at the UPJ.  Chronic fullness of the left lower pole collecting system without overt hydronephrosis or ureteral calculus.  Multiple bilateral renal calculi, with the largest right renal calculus measuring 11 mm and the largest left calculus measuring 4 mm.  Bladder: Circumferential bladder wall thickening, possibly from chronic outlet  obstruction.  Reproductive: Mild enlargement of the prostate, deforming the bladder base.  Bowel: No obstruction. No pericecal inflammation.  Retroperitoneum: No mass or adenopathy.  Peritoneum: No ascites or pneumoperitoneum.  Vascular: No acute abnormality.  OSSEOUS: No acute abnormalities.  IMPRESSION: 1. Obstructing 15 mm stone at the right UPJ. 2. 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 3. Extensive bilateral nephrolithiasis. 4. Additional chronic findings are noted above.   Electronically Signed   By: Tiburcio PeaJonathan  Watts M.D.   On: 08/06/2014 04:08   4:22 AM Pain control with IV Dilaudid. [facility].  Hanley SeamenJohn L Madeleine Fenn, MD 08/06/14 0423  Hanley SeamenJohn L Lorice Lafave, MD 08/06/14 [phone]

## 2015-03-04 NOTE — Patient Instructions (Signed)
Jacob Fleming  03/04/2015   Your procedure is scheduled on:  03/07/2015  [facility]  AM.  Call this number if you have problems the morning of surgery: [phone]   Remember:   Do not eat food or drink liquids after midnight.   Take these medicines the morning of surgery with A SIP OF WATER:  Norvasc, zyrtec, lisinopril, ativan, zofran, oxycodone, flomax   Do not wear jewelry, make-up or nail polish.  Do not wear lotions, powders, or perfumes.  Do not shave 48 hours prior to surgery. Men may shave face and neck.  Do not bring valuables to the hospital.  [facility] not [facility] for any belongings or valuables.               Contacts, dentures or bridgework may not be worn into surgery.  Leave suitcase in the car. After surgery it may be brought to your room.  For patients admitted to the hospital, discharge time is determined by your treatment team.               Patients discharged the day of surgery will not be allowed to drive home.  Name and phone number of your driver: family  Special Instructions: Shower using CHG 2 nights before surgery and the night before surgery.  If you shower the day of surgery use CHG.  Use special wash - you have one bottle of CHG for all showers.  You should use approximately 1/3 of the bottle for each shower.   Please read over the following fact sheets that you were given: Pain Booklet, Coughing and Deep Breathing, Surgical Site Infection Prevention, Anesthesia Post-op Instructions and Care and Recovery After Surgery Hernia A hernia occurs when an internal organ pushes out through a weak spot in the abdominal wall. Hernias most commonly occur in the groin and around the navel. Hernias often can be pushed back into place (reduced). Most hernias tend to get worse over time. Some abdominal hernias can get stuck in the opening (irreducible or incarcerated hernia) and cannot be reduced. An irreducible abdominal hernia which is  tightly squeezed into the opening is at risk for impaired blood supply (strangulated hernia). A strangulated hernia is a medical emergency. Because of the risk for an irreducible or strangulated hernia, surgery may be recommended to repair a hernia. CAUSES   Heavy lifting.  Prolonged coughing.  Straining to have a bowel movement.  A cut (incision) made during an abdominal surgery. HOME CARE INSTRUCTIONS   Bed rest is not required. You may continue your normal activities.  Avoid lifting more than 10 pounds (4.5 kg) or straining.  Cough gently. If you are a smoker it is best to stop. Even the best hernia repair can break down with the continual strain of coughing. Even if you do not have your hernia repaired, a cough will continue to aggravate the problem.  Do not wear anything tight over your hernia. Do not try to keep it in with an outside bandage or truss. These can damage abdominal contents if they are trapped within the hernia sac.  Eat a normal diet.  Avoid constipation. Straining over long periods of time will increase hernia size and encourage breakdown of repairs. If you cannot do this with diet alone, stool softeners may be used. SEEK IMMEDIATE MEDICAL CARE IF:   You have a fever.  You develop increasing abdominal pain.  You feel nauseous or vomit.  Your hernia is stuck outside the abdomen, looks discolored, feels hard, or is tender.  You have any changes in your bowel habits or in the hernia that are unusual for you.  You have increased pain or swelling around the hernia.  You cannot push the hernia back in place by applying gentle pressure while lying down. MAKE SURE YOU:   Understand these instructions.  Will watch your condition.  Will get help right away if you are not doing well or get worse. Document Released: 10/11/2005 Document Revised: 01/03/2012 Document Reviewed: 05/30/2008 [facility] Patient Information 2015 [employer] [place]. This information is not  intended to replace advice given to you by your health care provider. Make sure you discuss any questions you have with your health care provider. PATIENT INSTRUCTIONS POST-ANESTHESIA  IMMEDIATELY FOLLOWING SURGERY:  Do not drive or operate machinery for the first twenty four hours after surgery.  Do not make any important decisions for twenty four hours after surgery or while taking narcotic pain medications or sedatives.  If you develop intractable nausea and vomiting or a severe headache please notify your doctor immediately.  FOLLOW-UP:  Please make an appointment with your surgeon as instructed. You do not need to follow up with anesthesia unless specifically instructed to do so.  WOUND CARE INSTRUCTIONS (if applicable):  Keep a dry clean dressing on the anesthesia/puncture wound site if there is drainage.  Once the wound has quit draining you may leave it open to air.  Generally you should leave the bandage intact for twenty four hours unless there is drainage.  If the epidural site drains for more than 36-48 hours please call the anesthesia department.  QUESTIONS?:  Please feel free to call your physician or the hospital operator if you have any questions, and they will be happy to assist you.

## 2015-03-05 ENCOUNTER — Encounter (HOSPITAL_COMMUNITY): Payer: Self-pay

## 2015-03-05 ENCOUNTER — Encounter (HOSPITAL_COMMUNITY)
Admission: RE | Admit: 2015-03-05 | Discharge: 2015-03-05 | Disposition: A | Discharge status: Home / Self Care | Payer: Medicare Other | Source: Ambulatory Visit | Attending: Orthopedic Surgery | Admitting: Orthopedic Surgery

## 2015-03-05 ENCOUNTER — Other Ambulatory Visit: Payer: Self-pay

## 2015-03-05 DIAGNOSIS — N400 Enlarged prostate without lower urinary tract symptoms: Secondary | ICD-10-CM | POA: Diagnosis not present

## 2015-03-05 DIAGNOSIS — G309 Alzheimer's disease, unspecified: Secondary | ICD-10-CM | POA: Diagnosis not present

## 2015-03-05 DIAGNOSIS — E780 Pure hypercholesterolemia: Secondary | ICD-10-CM | POA: Diagnosis not present

## 2015-03-05 DIAGNOSIS — K4090 Unilateral inguinal hernia, without obstruction or gangrene, not specified as recurrent: Secondary | ICD-10-CM | POA: Diagnosis present

## 2015-03-05 LAB — BASIC METABOLIC PANEL
ANION GAP: 4 — AB (ref 5–15)
BUN: 38 mg/dL — ABNORMAL HIGH (ref 6–20)
CALCIUM: 9.4 mg/dL (ref 8.9–10.3)
CHLORIDE: 103 mmol/L (ref 101–111)
CO2: 31 mmol/L (ref 22–32)
Creatinine, Ser: 1.44 mg/dL — ABNORMAL HIGH (ref 0.61–1.24)
GFR calc Af Amer: 53 mL/min — ABNORMAL LOW (ref >60)
GFR calc non Af Amer: 46 mL/min — ABNORMAL LOW (ref >60)
Glucose, Bld: 96 mg/dL (ref 70–99)
Potassium: 4.7 mmol/L (ref 3.5–5.1)
SODIUM: 138 mmol/L (ref 135–145)

## 2015-03-05 LAB — CBC WITH DIFFERENTIAL/PLATELET
BASOS ABS: 0 10*3/uL (ref 0.0–0.1)
BASOS PCT: 1 % (ref 0–1)
Eosinophils Absolute: 0.1 10*3/uL (ref 0.0–0.7)
Eosinophils Relative: 2 % (ref 0–5)
HCT: 35.8 % — ABNORMAL LOW (ref 39.0–52.0)
Hemoglobin: 11.9 g/dL — ABNORMAL LOW (ref 13.0–17.0)
LYMPHS ABS: 1.7 10*3/uL (ref 0.7–4.0)
Lymphocytes Relative: 25 % (ref 12–46)
MCH: 31.7 pg (ref 26.0–34.0)
MCHC: 33.2 g/dL (ref 30.0–36.0)
MCV: 95.5 fL (ref 78.0–100.0)
MONO ABS: 0.7 10*3/uL (ref 0.1–1.0)
MONOS PCT: 11 % (ref 3–12)
NEUTROS PCT: 61 % (ref 43–77)
Neutro Abs: 4.1 10*3/uL (ref 1.7–7.7)
Platelets: 194 10*3/uL (ref 150–400)
RBC: 3.75 MIL/uL — ABNORMAL LOW (ref 4.22–5.81)
RDW: 13.2 % (ref 11.5–15.5)
WBC: 6.6 10*3/uL (ref 4.0–10.5)

## 2015-03-05 NOTE — H&P (Signed)
  NTS SOAP Note  Vital Signs:  Vitals as of: 03/04/2015: Systolic 158: Diastolic 70: Heart Rate 67: Temp 98.28F: Height 505ft 10in: Weight 150Lbs 0 Ounces: Pain Level 7: BMI 21.52  BMI : 21.52 kg/m2  Subjective: This 74 year old male presents for of a right inguinal hernia.  Has been present for several months.  Started hurting while on treadmill.  Made worse with straining.  Review of Symptoms:  Constitutional:unremarkable   Head:unremarkable Eyes:unremarkable   Nose/Mouth/Throat:unremarkable Cardiovascular:  unremarkable Respiratory:unremarkable Gastrointestinal:  unremarkable   Genitourinary:unremarkable   Musculoskeletal:unremarkable Skin:unremarkable Hematolgic/Lymphatic:unremarkable   seasonal allergies   Past Medical History:  Reviewed  Past Medical History  Surgical History: appendectomy as a child Medical Problems: alzheimer's, BPH, high cholesterol, horseshoe kidney Allergies: nkda Medications: kdur, mirtazapine, tamsulosin, HCTZ/lisinopril, donepezil   Social History:Reviewed  Social History  Preferred Language: English Race:  White Ethnicity: Not Hispanic / Latino Age: [age] year Marital Status:  W Alcohol: no   Smoking Status: Never smoker reviewed on 03/04/2015 Functional Status reviewed on 03/04/2015 ------------------------------------------------ Bathing: Normal Cooking: Normal Dressing: Normal Driving: Normal Eating: Normal Managing Meds: Normal Oral Care: Normal Shopping: Normal Toileting: Normal Transferring: Normal Walking: Normal Cognitive Status reviewed on 03/04/2015 ------------------------------------------------ Attention: Normal Decision Making: Normal Language: Normal Memory: Normal Motor: Normal Perception: Normal Problem Solving: Normal Visual and Spatial: Normal   Family History:Reviewed  Family Health History Mother, Deceased; Healthy;  Father, Deceased; Healthy;     Objective  Information: General:Well appearing, well nourished in no distress. Heart:RRR, no murmur Lungs:  CTA bilaterally, no wheezes, rhonchi, rales.  Breathing unlabored. Abdomen:Soft, NT/ND, no HSM, no masses.  Reducible right inguinal hernia. WE:[id number]:unremarkable    Assessment:Right inguinal hernia  Diagnoses: 550.90  K40.90 Inguinal hernia (Unilateral inguinal hernia, without obstruction or gangrene, not specified as recurrent)  Procedures: [id number] - OFFICE OUTPATIENT NEW 30 MINUTES    Plan:  Scheduled for right inguinal herniorrhaphy with mesh on 03/07/15.   Patient Education:Alternative treatments to surgery were discussed with patient (and family).  Risks and benefits  of procedure including bleeding, infection, mesh use, and the possibility of recurrence of the hernia were fully explained to the patient (and family) who gave informed consent. Patient/family questions were addressed.  Follow-up:Pending Surgery

## 2015-03-05 NOTE — Pre-Procedure Instructions (Signed)
Patient given information to sign up for my chart at home. 

## 2015-03-07 ENCOUNTER — Ambulatory Visit (HOSPITAL_COMMUNITY)
Admission: RE | Admit: 2015-03-07 | Discharge: 2015-03-07 | Disposition: A | Discharge status: Home / Self Care | Payer: Medicare Other | Source: Ambulatory Visit | Attending: General Surgery | Admitting: General Surgery

## 2015-03-07 ENCOUNTER — Encounter (HOSPITAL_COMMUNITY)
Admission: RE | Disposition: A | Discharge status: Home / Self Care | Payer: Self-pay | Source: Ambulatory Visit | Attending: General Surgery

## 2015-03-07 ENCOUNTER — Ambulatory Visit (HOSPITAL_COMMUNITY): Payer: Medicare Other | Admitting: Anesthesiology

## 2015-03-07 ENCOUNTER — Encounter (HOSPITAL_COMMUNITY): Payer: Self-pay | Admitting: *Deleted

## 2015-03-07 DIAGNOSIS — E780 Pure hypercholesterolemia: Secondary | ICD-10-CM | POA: Insufficient documentation

## 2015-03-07 DIAGNOSIS — K4090 Unilateral inguinal hernia, without obstruction or gangrene, not specified as recurrent: Secondary | ICD-10-CM | POA: Insufficient documentation

## 2015-03-07 DIAGNOSIS — N400 Enlarged prostate without lower urinary tract symptoms: Secondary | ICD-10-CM | POA: Diagnosis not present

## 2015-03-07 DIAGNOSIS — G309 Alzheimer's disease, unspecified: Secondary | ICD-10-CM | POA: Diagnosis not present

## 2015-03-07 HISTORY — PX: INGUINAL HERNIA REPAIR: SHX194

## 2015-03-07 SURGERY — REPAIR, HERNIA, INGUINAL, ADULT
Anesthesia: General | Site: Groin | Laterality: Right

## 2015-03-07 MED ORDER — SODIUM CHLORIDE 0.9 % IJ SOLN
INTRAMUSCULAR | Status: AC
  Filled 2015-03-07: qty 10

## 2015-03-07 MED ORDER — LIDOCAINE HCL (PF) 1 % IJ SOLN
INTRAMUSCULAR | Status: AC
  Filled 2015-03-07: qty 5

## 2015-03-07 MED ORDER — FENTANYL CITRATE (PF) 100 MCG/2ML IJ SOLN
25.0000 ug | INTRAMUSCULAR | Status: DC | PRN
  Administered 2015-03-07 (×2): 50 ug via INTRAVENOUS
  Filled 2015-03-07: qty 2

## 2015-03-07 MED ORDER — KETOROLAC TROMETHAMINE 30 MG/ML IJ SOLN
30.0000 mg | Freq: Once | INTRAMUSCULAR | Status: AC
  Administered 2015-03-07: 30 mg via INTRAVENOUS
  Filled 2015-03-07: qty 1

## 2015-03-07 MED ORDER — FENTANYL CITRATE (PF) 100 MCG/2ML IJ SOLN
INTRAMUSCULAR | Status: DC | PRN
  Administered 2015-03-07: 50 ug via INTRAVENOUS

## 2015-03-07 MED ORDER — PROPOFOL 10 MG/ML IV BOLUS
INTRAVENOUS | Status: DC | PRN
  Administered 2015-03-07: 150 mg via INTRAVENOUS

## 2015-03-07 MED ORDER — BUPIVACAINE LIPOSOME 1.3 % IJ SUSP
INTRAMUSCULAR | Status: AC
  Filled 2015-03-07: qty 20

## 2015-03-07 MED ORDER — PROPOFOL 10 MG/ML IV BOLUS
INTRAVENOUS | Status: AC
  Filled 2015-03-07: qty 20

## 2015-03-07 MED ORDER — SODIUM CHLORIDE 0.9 % IR SOLN
Status: DC | PRN
  Administered 2015-03-07: 500 mL

## 2015-03-07 MED ORDER — EPHEDRINE SULFATE 50 MG/ML IJ SOLN
INTRAMUSCULAR | Status: DC | PRN
  Administered 2015-03-07: 10 mg via INTRAVENOUS
  Administered 2015-03-07: 5 mg via INTRAVENOUS

## 2015-03-07 MED ORDER — GLYCOPYRROLATE 0.2 MG/ML IJ SOLN
INTRAMUSCULAR | Status: AC
  Filled 2015-03-07: qty 1

## 2015-03-07 MED ORDER — ONDANSETRON HCL 4 MG/2ML IJ SOLN
4.0000 mg | Freq: Once | INTRAMUSCULAR | Status: AC
  Administered 2015-03-07: 4 mg via INTRAVENOUS

## 2015-03-07 MED ORDER — BUPIVACAINE LIPOSOME 1.3 % IJ SUSP
INTRAMUSCULAR | Status: DC | PRN
  Administered 2015-03-07: 20 mL

## 2015-03-07 MED ORDER — GLYCOPYRROLATE 0.2 MG/ML IJ SOLN
INTRAMUSCULAR | Status: DC | PRN
  Administered 2015-03-07: 0.2 mg via INTRAVENOUS

## 2015-03-07 MED ORDER — CEFAZOLIN SODIUM-DEXTROSE 2-3 GM-% IV SOLR
INTRAVENOUS | Status: AC
  Filled 2015-03-07: qty 50

## 2015-03-07 MED ORDER — CHLORHEXIDINE GLUCONATE 4 % EX LIQD: 1.0000 "application " | Freq: Once | CUTANEOUS | Status: DC

## 2015-03-07 MED ORDER — FENTANYL CITRATE (PF) 100 MCG/2ML IJ SOLN
INTRAMUSCULAR | Status: AC
  Filled 2015-03-07: qty 2

## 2015-03-07 MED ORDER — MIDAZOLAM HCL 2 MG/2ML IJ SOLN
1.0000 mg | INTRAMUSCULAR | Status: DC | PRN
  Administered 2015-03-07: 2 mg via INTRAVENOUS

## 2015-03-07 MED ORDER — MIDAZOLAM HCL 2 MG/2ML IJ SOLN
INTRAMUSCULAR | Status: AC
  Filled 2015-03-07: qty 2

## 2015-03-07 MED ORDER — FENTANYL CITRATE (PF) 100 MCG/2ML IJ SOLN
25.0000 ug | INTRAMUSCULAR | Status: AC
  Administered 2015-03-07 (×2): 25 ug via INTRAVENOUS

## 2015-03-07 MED ORDER — LACTATED RINGERS IV SOLN
INTRAVENOUS | Status: DC
  Administered 2015-03-07: 09:00:00 via INTRAVENOUS

## 2015-03-07 MED ORDER — ONDANSETRON HCL 4 MG/2ML IJ SOLN
INTRAMUSCULAR | Status: AC
  Filled 2015-03-07: qty 2

## 2015-03-07 MED ORDER — HYDROCODONE-ACETAMINOPHEN 5-325 MG PO TABS: 1.0000 | ORAL_TABLET | Freq: Four times a day (QID) | ORAL | Status: AC | PRN

## 2015-03-07 MED ORDER — FENTANYL CITRATE (PF) 100 MCG/2ML IJ SOLN
INTRAMUSCULAR | Status: AC
Start: 2015-03-07 — End: 2015-03-07
  Filled 2015-03-07: qty 2

## 2015-03-07 MED ORDER — ONDANSETRON HCL 4 MG/2ML IJ SOLN: 4.0000 mg | Freq: Once | INTRAMUSCULAR | Status: DC | PRN

## 2015-03-07 MED ORDER — LIDOCAINE HCL (CARDIAC) 10 MG/ML IV SOLN
INTRAVENOUS | Status: DC | PRN
  Administered 2015-03-07: 50 mg via INTRAVENOUS

## 2015-03-07 MED ORDER — CEFAZOLIN SODIUM-DEXTROSE 2-3 GM-% IV SOLR
2.0000 g | INTRAVENOUS | Status: AC
  Administered 2015-03-07: 2 g via INTRAVENOUS

## 2015-03-07 MED ORDER — EPHEDRINE SULFATE 50 MG/ML IJ SOLN
INTRAMUSCULAR | Status: AC
  Filled 2015-03-07: qty 1

## 2015-03-07 SURGICAL SUPPLY — 35 items
BAG HAMPER (MISCELLANEOUS) ×3 IMPLANT
CLOTH BEACON ORANGE TIMEOUT ST (SAFETY) ×3 IMPLANT
COVER LIGHT HANDLE STERIS (MISCELLANEOUS) ×6 IMPLANT
DRAIN PENROSE 18X1/2 LTX STRL (DRAIN) ×3 IMPLANT
ELECT REM PT RETURN 9FT ADLT (ELECTROSURGICAL) ×3
ELECTRODE REM PT RTRN 9FT ADLT (ELECTROSURGICAL) ×1 IMPLANT
GLOVE BIOGEL M 6.5 STRL (GLOVE) ×2 IMPLANT
GLOVE INDICATOR 7.0 STRL GRN (GLOVE) ×2 IMPLANT
GLOVE SURG SS PI 7.5 STRL IVOR (GLOVE) ×5 IMPLANT
GOWN STRL REUS W/ TWL XL LVL3 (GOWN DISPOSABLE) ×1 IMPLANT
GOWN STRL REUS W/TWL LRG LVL3 (GOWN DISPOSABLE) ×6 IMPLANT
GOWN STRL REUS W/TWL XL LVL3 (GOWN DISPOSABLE) ×3
INST SET MINOR GENERAL (KITS) ×3 IMPLANT
KIT ROOM TURNOVER APOR (KITS) ×3 IMPLANT
LIQUID BAND (GAUZE/BANDAGES/DRESSINGS) ×3 IMPLANT
MANIFOLD NEPTUNE II (INSTRUMENTS) ×3 IMPLANT
MESH MARLEX PLUG MEDIUM (Mesh General) ×2 IMPLANT
NDL HYPO 21X1.5 SAFETY (NEEDLE) ×1 IMPLANT
NEEDLE HYPO 21X1.5 SAFETY (NEEDLE) ×3 IMPLANT
NS IRRIG 1000ML POUR BTL (IV SOLUTION) ×3 IMPLANT
PACK MINOR (CUSTOM PROCEDURE TRAY) ×3 IMPLANT
PAD ARMBOARD 7.5X6 YLW CONV (MISCELLANEOUS) ×3 IMPLANT
SCRUB PCMX 4 OZ (MISCELLANEOUS) ×3 IMPLANT
SET BASIN LINEN APH (SET/KITS/TRAYS/PACK) ×3 IMPLANT
SUT NOVA NAB GS-22 2 2-0 T-19 (SUTURE) ×4 IMPLANT
SUT NOVAFIL NAB HGS22 2-0 30IN (SUTURE) IMPLANT
SUT SILK 3 0 (SUTURE)
SUT SILK 3-0 18XBRD TIE 12 (SUTURE) IMPLANT
SUT VIC AB 2-0 CT1 27 (SUTURE) ×3
SUT VIC AB 2-0 CT1 TAPERPNT 27 (SUTURE) ×1 IMPLANT
SUT VIC AB 3-0 SH 27 (SUTURE) ×3
SUT VIC AB 3-0 SH 27X BRD (SUTURE) ×1 IMPLANT
SUT VIC AB 4-0 PS2 27 (SUTURE) ×3 IMPLANT
SUT VICRYL AB 3 0 TIES (SUTURE) ×2 IMPLANT
SYR 20CC LL (SYRINGE) ×3 IMPLANT

## 2015-03-07 NOTE — Transfer of Care (Signed)
Immediate Anesthesia Transfer of Care Note  Patient: Jacob Fleming  Procedure(s) Performed: Procedure(s): HERNIA REPAIR INGUINAL ADULT WITH MESH (Right)  Patient Location: PACU  Anesthesia Type:General  Level of Consciousness: sedated and patient cooperative  Airway & Oxygen Therapy: Patient Spontanous Breathing and non-rebreather face mask  Post-op Assessment: Report given to RN, Post -op Vital signs reviewed and stable and Patient moving all extremities  Post vital signs: Reviewed and stable    Complications: No apparent anesthesia complications

## 2015-03-07 NOTE — Anesthesia Procedure Notes (Signed)
Procedure Name: LMA Insertion Date/Time: 03/07/2015 9:40 AM Performed by: Pernell DupreADAMS, AMY A Pre-anesthesia Checklist: Patient identified, Timeout performed, Emergency Drugs available, Suction available and Patient being monitored Patient Re-evaluated:Patient Re-evaluated prior to inductionOxygen Delivery Method: Circle system utilized Preoxygenation: Pre-oxygenation with 100% oxygen Intubation Type: IV induction Ventilation: Mask ventilation without difficulty LMA: LMA inserted LMA Size: 4.0 Number of attempts: 1 Placement Confirmation: positive ETCO2 and breath sounds checked- equal and bilateral Tube secured with: Tape

## 2015-03-07 NOTE — Anesthesia Postprocedure Evaluation (Signed)
  Anesthesia Post-op Note  Patient: Jacob Fleming  Procedure(s) Performed: Procedure(s): HERNIA REPAIR INGUINAL ADULT WITH MESH (Right)  Patient Location: PACU  Anesthesia Type:General  Level of Consciousness: awake and alert   Airway and Oxygen Therapy: Patient Spontanous Breathing  Post-op Pain: none  Post-op Assessment: Post-op Vital signs reviewed, Patient's Cardiovascular Status Stable, Respiratory Function Stable, Patent Airway and No signs of Nausea or vomiting  Post-op Vital Signs: Reviewed and stable  Last Vitals:  Filed Vitals:   03/07/15 1130  BP: 117/59  Pulse: 62  Temp:   Resp: 3    Complications: No apparent anesthesia complications

## 2015-03-07 NOTE — Interval H&P Note (Signed)
History and Physical Interval Note:  03/07/2015 8:48 AM  Jacob Fleming  has presented today for surgery, with the diagnosis of right inguinal hernia  The various methods of treatment have been discussed with the patient and family. After consideration of risks, benefits and other options for treatment, the patient has consented to  Procedure(s): HERNIA REPAIR INGUINAL ADULT WITH MESH (Right) as a surgical intervention .  The patient's history has been reviewed, patient examined, no change in status, stable for surgery.  I have reviewed the patient's chart and labs.  Questions were answered to the patient's satisfaction.     Franky MachoJENKINS,Kodi Guerrera A

## 2015-03-07 NOTE — Anesthesia Preprocedure Evaluation (Signed)
Anesthesia Evaluation  Patient identified by MRN, date of birth, ID band Patient awake    Reviewed: Allergy & Precautions, H&P , NPO status , Patient's Chart, lab work & pertinent test results  History of Anesthesia Complications Negative for: history of anesthetic complications  Airway Mallampati: I  TM Distance: >3 FB     Dental  (+) Teeth Intact   Pulmonary neg pulmonary ROS,    Pulmonary exam normal       Cardiovascular hypertension, Rhythm:Regular Rate:Normal     Neuro/Psych PSYCHIATRIC DISORDERS Anxiety    GI/Hepatic   Endo/Other    Renal/GU      Musculoskeletal   Abdominal   Peds  Hematology   Anesthesia Other Findings   Reproductive/Obstetrics                             Anesthesia Physical Anesthesia Plan  ASA: II  Anesthesia Plan: General   Post-op Pain Management:    Induction: Intravenous  Airway Management Planned: LMA  Additional Equipment:   Intra-op Plan:   Post-operative Plan: Extubation in OR  Informed Consent: I have reviewed the patients History and Physical, chart, labs and discussed the procedure including the risks, benefits and alternatives for the proposed anesthesia with the patient or authorized representative who has indicated his/her understanding and acceptance.     Plan Discussed with:   Anesthesia Plan Comments:         Anesthesia Quick Evaluation

## 2015-03-07 NOTE — Discharge Instructions (Signed)
Inguinal Hernia, Adult  Care After Refer to this sheet in the next few weeks. These discharge instructions provide you with general information on caring for yourself after you leave the hospital. Your caregiver may also give you specific instructions. Your treatment has been planned according to the most current medical practices available, but unavoidable complications sometimes occur. If you have any problems or questions after discharge, please call your caregiver. HOME CARE INSTRUCTIONS  Put ice on the operative site.  Put ice in a plastic bag.  Place a towel between your skin and the bag.  Leave the ice on for 15-20 minutes at a time, 03-04 times a day while awake.  Change bandages (dressings) as directed.  Keep the wound dry and clean. The wound may be washed gently with soap and water. Gently blot or dab the wound dry. It is okay to take showers 24 to 48 hours after surgery. Do not take baths, use swimming pools, or use hot tubs for 10 days, or as directed by your caregiver.  Only take over-the-counter or prescription medicines for pain, discomfort, or fever as directed by your caregiver.  Continue your normal diet as directed.  Do not lift anything more than 10 pounds or play contact sports for 3 weeks, or as directed. SEEK MEDICAL CARE IF:  There is redness, swelling, or increasing pain in the wound.  There is fluid (pus) coming from the wound.  There is drainage from a wound lasting longer than 1 day.  You have an oral temperature above 102 F (38.9 C).  You notice a bad smell coming from the wound or dressing.  The wound breaks open after the stitches (sutures) have been removed.  You notice increasing pain in the shoulders (shoulder strap areas).  You develop dizzy episodes or fainting while standing.  You feel sick to your stomach (nauseous) or throw up (vomit). SEEK IMMEDIATE MEDICAL CARE IF:  You develop a rash.  You have difficulty breathing.  You  develop a reaction or have side effects to medicines you were given. MAKE SURE YOU:   Understand these instructions.  Will watch your condition.  Will get help right away if you are not doing well or get worse.

## 2015-03-07 NOTE — Op Note (Signed)
Patient:  Jacob Fleming  DOB:  12-10-1940  MRN:  [id number]   Preop Diagnosis:  Right inguinal hernia  Postop Diagnosis:  Same  Procedure:  Right inguinal herniorrhaphy with mesh  Surgeon:  Franky MachoMark Terryann Verbeek, M.D.  Anes:  Gen.  Indications:  Patient is a [age] year old white male who presents with a symptomatic right inguinal hernia. The risks and benefits of the procedure including bleeding, infection, mesh use, and the possibility of recurrence of the hernia were fully explained to the patient, who gave informed consent.  Procedure note:  The patient was placed the supine position. After general anesthesia was Mr., the right groin region was prepped and draped using usual sterile technique with Hibiclens. Surgical site confirmation was performed.  A transverse incision was made in the right groin region down to the external oblique aponeuroses. The aponeurosis was incised to the external ring. A Penrose drain was placed around the spermatic cord. The vas deferens was noted within the spermatic cord. The iliac inguinal nerve was identified and retracted inferiorly from the operative field. An indirect hernia sac was found. This was freed away from the spermatic cord up to the peritoneal reflection and inverted. A medium size Bard PerFix plug was then inserted. An onlay patch was then placed along the floor of inguinal canal and secured superiorly to the conjoined tendon and inferiorly to the shelving edge of Poupart's ligament using 2-0 Novafil interrupted sutures. The internal ring was re-created using a 2-0 Novafil interrupted suture. The external oblique aponeuroses was reapproximated using a 2-0 Vicryl running suture. Subcutaneous layer was reapproximated using 3-0 Vicryl interrupted sutures. Exparel was instilled the surrounding wound. The skin was closed using a 4-0 Vicryl subcuticular suture. Liquiband was applied.  All tape and needle counts were correct the end of the procedure. Patient was  awakened and transferred to PACU in stable condition.  Complications:  None  EBL:  Minimal  Specimen:  None

## 2015-03-10 ENCOUNTER — Encounter (HOSPITAL_COMMUNITY): Payer: Self-pay | Admitting: General Surgery

## 2017-09-05 ENCOUNTER — Other Ambulatory Visit (HOSPITAL_COMMUNITY): Payer: Self-pay | Admitting: Family Medicine

## 2017-09-05 DIAGNOSIS — Z79899 Other long term (current) drug therapy: Secondary | ICD-10-CM

## 2017-09-09 ENCOUNTER — Encounter (HOSPITAL_COMMUNITY): Payer: Self-pay

## 2017-09-09 ENCOUNTER — Other Ambulatory Visit (HOSPITAL_COMMUNITY): Payer: BLUE CROSS/BLUE SHIELD

## 2017-11-15 ENCOUNTER — Other Ambulatory Visit: Payer: Self-pay

## 2017-11-15 ENCOUNTER — Encounter (HOSPITAL_COMMUNITY): Payer: Self-pay | Admitting: Emergency Medicine

## 2017-11-15 ENCOUNTER — Inpatient Hospital Stay (HOSPITAL_COMMUNITY)
Admission: EM | Admit: 2017-11-15 | Discharge: 2017-11-17 | DRG: 069 | Disposition: A | Discharge status: Home Health | Payer: Medicare Other | Attending: Internal Medicine | Admitting: Internal Medicine

## 2017-11-15 ENCOUNTER — Inpatient Hospital Stay (HOSPITAL_COMMUNITY): Payer: Medicare Other

## 2017-11-15 ENCOUNTER — Emergency Department (HOSPITAL_COMMUNITY): Payer: Medicare Other

## 2017-11-15 DIAGNOSIS — R42 Dizziness and giddiness: Secondary | ICD-10-CM | POA: Diagnosis present

## 2017-11-15 DIAGNOSIS — Z833 Family history of diabetes mellitus: Secondary | ICD-10-CM | POA: Diagnosis not present

## 2017-11-15 DIAGNOSIS — Z7982 Long term (current) use of aspirin: Secondary | ICD-10-CM

## 2017-11-15 DIAGNOSIS — Z79899 Other long term (current) drug therapy: Secondary | ICD-10-CM | POA: Diagnosis not present

## 2017-11-15 DIAGNOSIS — I10 Essential (primary) hypertension: Secondary | ICD-10-CM | POA: Diagnosis present

## 2017-11-15 DIAGNOSIS — E785 Hyperlipidemia, unspecified: Secondary | ICD-10-CM | POA: Diagnosis present

## 2017-11-15 DIAGNOSIS — N400 Enlarged prostate without lower urinary tract symptoms: Secondary | ICD-10-CM | POA: Diagnosis present

## 2017-11-15 DIAGNOSIS — G459 Transient cerebral ischemic attack, unspecified: Principal | ICD-10-CM

## 2017-11-15 DIAGNOSIS — F0390 Unspecified dementia without behavioral disturbance: Secondary | ICD-10-CM | POA: Diagnosis present

## 2017-11-15 DIAGNOSIS — I361 Nonrheumatic tricuspid (valve) insufficiency: Secondary | ICD-10-CM | POA: Diagnosis not present

## 2017-11-15 DIAGNOSIS — F05 Delirium due to known physiological condition: Secondary | ICD-10-CM | POA: Diagnosis present

## 2017-11-15 LAB — APTT: aPTT: 30 seconds (ref 24–36)

## 2017-11-15 LAB — URINALYSIS, ROUTINE W REFLEX MICROSCOPIC
Bilirubin Urine: NEGATIVE
Glucose, UA: NEGATIVE mg/dL
Hgb urine dipstick: NEGATIVE
Ketones, ur: NEGATIVE mg/dL
Leukocytes, UA: NEGATIVE
Nitrite: NEGATIVE
Protein, ur: NEGATIVE mg/dL
Specific Gravity, Urine: 1.008 (ref 1.005–1.030)
pH: 8 (ref 5.0–8.0)

## 2017-11-15 LAB — BASIC METABOLIC PANEL
Anion gap: 12 (ref 5–15)
BUN: 16 mg/dL (ref 6–20)
CO2: 29 mmol/L (ref 22–32)
Calcium: 9.9 mg/dL (ref 8.9–10.3)
Chloride: 101 mmol/L (ref 101–111)
Creatinine, Ser: 1.3 mg/dL — ABNORMAL HIGH (ref 0.61–1.24)
GFR calc Af Amer: 59 mL/min — ABNORMAL LOW (ref >60)
GFR calc non Af Amer: 51 mL/min — ABNORMAL LOW (ref >60)
Glucose, Bld: 117 mg/dL — ABNORMAL HIGH (ref 65–99)
Potassium: 3.9 mmol/L (ref 3.5–5.1)
Sodium: 142 mmol/L (ref 135–145)

## 2017-11-15 LAB — CBC WITH DIFFERENTIAL/PLATELET
Basophils Absolute: 0 10*3/uL (ref 0.0–0.1)
Basophils Relative: 0 %
Eosinophils Absolute: 0 10*3/uL (ref 0.0–0.7)
Eosinophils Relative: 0 %
HCT: 41 % (ref 39.0–52.0)
Hemoglobin: 13.8 g/dL (ref 13.0–17.0)
Lymphocytes Relative: 17 %
Lymphs Abs: 1.3 10*3/uL (ref 0.7–4.0)
MCH: 32.6 pg (ref 26.0–34.0)
MCHC: 33.7 g/dL (ref 30.0–36.0)
MCV: 96.9 fL (ref 78.0–100.0)
Monocytes Absolute: 0.6 10*3/uL (ref 0.1–1.0)
Monocytes Relative: 7 %
Neutro Abs: 5.8 10*3/uL (ref 1.7–7.7)
Neutrophils Relative %: 76 %
Platelets: 231 10*3/uL (ref 150–400)
RBC: 4.23 MIL/uL (ref 4.22–5.81)
RDW: 12.3 % (ref 11.5–15.5)
WBC: 7.7 10*3/uL (ref 4.0–10.5)

## 2017-11-15 LAB — CBG MONITORING, ED: Glucose-Capillary: 95 mg/dL (ref 65–99)

## 2017-11-15 LAB — TROPONIN I: Troponin I: <0.03 ng/mL (ref <0.03)

## 2017-11-15 LAB — PROTIME-INR
INR: 1.05
Prothrombin Time: 13.6 seconds (ref 11.4–15.2)

## 2017-11-15 MED ORDER — ASPIRIN 81 MG PO CHEW
324.0000 mg | CHEWABLE_TABLET | Freq: Once | ORAL | Status: AC
  Administered 2017-11-15: 324 mg via ORAL
  Filled 2017-11-15: qty 4

## 2017-11-15 MED ORDER — LORAZEPAM 2 MG/ML IJ SOLN
1.0000 mg | Freq: Once | INTRAMUSCULAR | Status: AC
Start: 2017-11-15 — End: 2017-11-15
  Administered 2017-11-15: 1 mg via INTRAVENOUS
  Filled 2017-11-15: qty 1

## 2017-11-15 NOTE — ED Triage Notes (Signed)
Complaining of headache, weakness, gait is unsteady, pt called his daughter to say something was wrong

## 2017-11-15 NOTE — ED Provider Notes (Signed)
[facility] EMERGENCY DEPARTMENT Provider Note   CSN: [id number] Arrival date & time: 11/15/17  1901     History   Chief Complaint Chief Complaint  Patient presents with  . Weakness    HPI Jacob Fleming is a 77 y.o. male.  HPI   Called to bedside by nursing shortly after patient arrival.  Patient states that around 3:30 PM today he began feeling "not right."  Vague sensation that "something just is not right."  "I felt really shaky all over like I had no strength" but no focal weakness. He feels very off balance. Can't tell me if he felt like he was falling towards one side or the other. Denies room spinning. No nausea. Headache which he describes as gradual in onset.  Hurts both in the frontal region and also in the occipital region.  Does not lateralize.  He feels like he is off balance.  Acute visual complaints.  No nausea.  No change in speech.  No numbness or tingling.  Past Medical History:  Diagnosis Date  . Anxiety   . BPH (benign prostatic hyperplasia)   . History of kidney stones   . Hyperlipidemia   . Hypertension     Patient Active Problem List   Diagnosis Date Noted  . HYPERLIPIDEMIA 08/01/2009  . HYPERTENSION 08/01/2009    Past Surgical History:  Procedure Laterality Date  . APPENDECTOMY    . BACK SURGERY     L5-L6  . CYSTOSCOPY W/ RETROGRADES     pyelogram  . CYSTOSCOPY/RETROGRADE/URETEROSCOPY/STONE EXTRACTION WITH BASKET Left 05/09/2013   Procedure: CYSTOSCOPY/LEFT RETROGRADE/URETEROSCOPY/STONE EXTRACTION WITH BASKET;  Surgeon: Ky BarbanMohammad I Javaid, MD;  Location: [facility];  Service: Urology;  Laterality: Left;  . HOLMIUM LASER APPLICATION N/A 05/09/2013   Procedure: HOLMIUM LASER APPLICATION;  Surgeon: Ky BarbanMohammad I Javaid, MD;  Location: [facility];  Service: Urology;  Laterality: N/A;  . INGUINAL HERNIA REPAIR Right 03/07/2015   Procedure: HERNIA REPAIR INGUINAL ADULT WITH MESH;  Surgeon: Franky MachoMark Jenkins Md, MD;  Location: [facility];  Service: General;  Laterality:  Right;  . LITHOTRIPSY N/A 05/09/2013   Procedure: LITHOTRIPSY;  Surgeon: Ky BarbanMohammad I Javaid, MD;  Location: [facility];  Service: Urology;  Laterality: N/A;  . TONSILLECTOMY         Home Medications    Prior to Admission medications   Medication Sig Start Date End Date Taking? Authorizing Provider  Cholecalciferol (VITAMIN D3) 1000 UNITS CAPS Take 1,000 Units by mouth daily.     [provider]  doxepin (SINEQUAN) 10 MG capsule Take 10 mg by mouth at bedtime as needed (sleep).    [provider]  FINASTERIDE PO Take 5 mg by mouth daily.    [provider]  ibuprofen (ADVIL,MOTRIN) 800 MG tablet Take 800 mg by mouth every 8 (eight) hours as needed for moderate pain.    [provider]  lisinopril-hydrochlorothiazide (PRINZIDE,ZESTORETIC) 20-25 MG per tablet Take 1 tablet by mouth daily.    [provider]  loratadine (CLARITIN) 10 MG tablet Take 10 mg by mouth daily.    [provider]  methylcellulose (ARTIFICIAL TEARS) 1 % ophthalmic solution Place 1 drop into both eyes 2 (two) times daily as needed (dry eyes).    [provider]  multivitamin [facility](THERAGRAN) per tablet Take 1 tablet by mouth daily.      [provider]  potassium citrate (UROCIT-K) 10 MEQ (1080 MG) SR tablet Take 10 mEq by mouth 2 (two) times daily.  [provider]  Tamsulosin HCl (FLOMAX) 0.4 MG CAPS Take 0.4 mg by mouth daily.      [provider]  zolpidem (AMBIEN) 10 MG tablet Take 10 mg by mouth at bedtime as needed for sleep.    [provider]    Family History Family History  Problem Relation Age of Onset  . Dementia Mother   . Other Father        perforated intestine  . Diabetes Brother   . Diabetes Sister   . Coronary artery disease Unknown   . Diabetes Unknown   . Hypertension Unknown     Social History Social History   Tobacco Use  . Smoking status: Never Smoker  . Smokeless tobacco: Never Used    Substance Use Topics  . Alcohol use: No  . Drug use: No     Allergies   Patient has no known allergies.   Review of Systems Review of Systems  All systems reviewed and negative, other than as noted in HPI.   Physical Exam Updated Vital Signs BP (!) 164/87 (BP Location: Right Arm)   Pulse 86   Temp (!) 97.5 F (36.4 C) (Oral)   Resp 18   Ht 5\' 11"  (1.803 m)   Wt 65.8 kg (145 lb)   SpO2 100%   BMI 20.22 kg/m   Physical Exam  Constitutional: He appears well-developed and well-nourished. No distress.  HENT:  Head: Normocephalic and atraumatic.  Eyes: Conjunctivae are normal. Right eye exhibits no discharge. Left eye exhibits no discharge.  Neck: Neck supple.  Cardiovascular: Normal rate, regular rhythm and normal heart sounds. Exam reveals no gallop and no friction rub.  No murmur heard. Pulmonary/Chest: Effort normal and breath sounds normal. No respiratory distress.  Abdominal: Soft. He exhibits no distension. There is no tenderness.  Musculoskeletal: He exhibits no edema or tenderness.  Neurological: He is alert.  Oriented x3. Speech clear. Content appropriate. CN 2-12 intact. Visual fields intact. Strength 5/5 all extremities. Sensation intact to light touch. Good finger to nose b/l. Could sit up unassisted but kept wanting to close his eyes. No nystagmus. Could also stand with feet together.   Skin: Skin is warm and dry.  Psychiatric: He has a normal mood and affect. His behavior is normal. Thought content normal.  Nursing note and vitals reviewed.    ED Treatments / Results  Labs (all labs ordered are listed, but only abnormal results are displayed) Labs Reviewed  BASIC METABOLIC PANEL - Abnormal; Notable for the following components:      Result Value   Glucose, Bld 117 (*)    Creatinine, Ser 1.30 (*)    GFR calc non Af Amer 51 (*)    GFR calc Af Amer 59 (*)    All other components within normal limits  URINALYSIS, ROUTINE W REFLEX MICROSCOPIC -  Abnormal; Notable for the following components:   APPearance CLOUDY (*)    All other components within normal limits  CBC WITH DIFFERENTIAL/PLATELET  PROTIME-INR  APTT  TROPONIN I  CBG MONITORING, ED    EKG  EKG Interpretation  Date/Time:  Tuesday November 15 2017 20:09:33 EST Ventricular Rate:  70 PR Interval:    QRS Duration: 105 QT Interval:  409 QTC Calculation: 442 R Axis:   -79 Text Interpretation:  Sinus rhythm Inferior infarct, old Confirmed by Raeford Razor [phone]) on 11/15/2017 8:21:40 PM       Radiology Ct Head Wo Contrast  Result Date: 11/15/2017 CLINICAL DATA:  Altered level of consciousness. Headache and weakness. EXAM: CT HEAD WITHOUT CONTRAST TECHNIQUE: Contiguous axial images were obtained from the base of the skull through the vertex without intravenous contrast. COMPARISON:  Brain MRI 09/25/2013 FINDINGS: Brain: No evidence of acute infarction, hemorrhage, hydrocephalus, extra-axial collection or mass lesion/mass effect. Atrophy most notable in the medial temporal lobes. Chronic small vessel ischemia with confluent low-density in the periventricular white matter. Vascular: Atherosclerotic calcification. Skull: Negative Sinuses/Orbits: Negative IMPRESSION: 1. No acute finding. 2. Moderate atrophy most notable in the medial temporal lobes. Chronic small vessel ischemia. Electronically Signed   By: Marnee Spring M.D.   On: 11/15/2017 19:53    Procedures Procedures (including critical care time)  Medications Ordered in ED Medications - No data to display   Initial Impression / Assessment and Plan / ED Course  I have reviewed the triage vital signs and the nursing notes.  Pertinent labs & imaging results that were available during my care of the patient were reviewed by me and considered in my medical decision making (see chart for details).  Clinical Course as of Nov 15 1942  Tue Nov 15, 2017  1936 CT called to have pt go next. Concern for possible  posterior circulation stroke or head bleed. Symptoms concerning but NIH stroke score is zero. Code stroke deferred.   [SK]    Clinical Course User Index [SK] Raeford Razor, MD    [phone] with headache and gait instability now resolved. TIA? Describes possible cerebellar symptoms but my exam is nonfocal. Head CT done within 6 hours of symptom onset and negative for bleed. Will admit for TIA w/u.   Final Clinical Impressions(s) / ED Diagnoses   Final diagnoses:  TIA (transient ischemic attack)    ED Discharge Orders    None       Raeford Razor, MD 11/15/17 2133

## 2017-11-15 NOTE — ED Notes (Signed)
Pt to CT at this time.

## 2017-11-15 NOTE — ED Notes (Signed)
EDP, Dr. Juleen ChinaKohut at bedside

## 2017-11-15 NOTE — H&P (Signed)
TRH H&P   Patient Demographics:    Jacob Fleming, is a 77 y.o. male  MRN: [id number]   DOB - September 06, 1941  Admit Date - 11/15/2017  Outpatient Primary MD for the patient is Gareth MorganKnowlton, Steve, MD  Referring MD/NP/PA: Raeford RazorStephen Kohut  Outpatient Specialists:   Patient coming from: home  Chief Complaint  Patient presents with  . Weakness      HPI:    Jacob Fleming  is a 77 y.o. male, w hypertension, hyperlipidemia, bph, anxiety, ? Dementia, apparently presents with c/o dizziness and generally not feeling well.  Pt is a poor historian.  His daughter was concerned about his dizziness and therefore brought him into ED.   In Ed,  Na 142, K 3.9,  Glucose 117 Bun 16, Creatinine 1.30  Wbc 7.7, Hgb 13.8, Plt 231  INR 1.05   Urine negative Trop I <0.03  CT brain IMPRESSION: 1. No acute finding. 2. Moderate atrophy most notable in the medial temporal lobes. Chronic small vessel ischemia.   Pt will be admitted for dizziness r/o CVA    Review of systems:    In addition to the HPI above,  No Fever-chills, No Headache, No changes with Vision or hearing, No problems swallowing food or Liquids, No Chest pain, Cough or Shortness of Breath, No Abdominal pain, No Nausea or Vommitting, Bowel movements are regular, No Blood in stool or Urine, No dysuria, No new skin rashes or bruises, No new joints pains-aches,  No new weakness, tingling, numbness in any extremity, No recent weight gain or loss, No polyuria, polydypsia or polyphagia, No significant Mental Stressors.  A full 10 point Review of Systems was done, except as stated above, all other Review of Systems were negative.   With Past History of the following :    Past Medical History:  Diagnosis Date  . Anxiety   . BPH (benign prostatic hyperplasia)   . History of kidney stones   . Hyperlipidemia   . Hypertension         Past Surgical History:  Procedure Laterality Date  . APPENDECTOMY    . BACK SURGERY     L5-L6  . CYSTOSCOPY W/ RETROGRADES     pyelogram  . CYSTOSCOPY/RETROGRADE/URETEROSCOPY/STONE EXTRACTION WITH BASKET Left 05/09/2013   Procedure: CYSTOSCOPY/LEFT RETROGRADE/URETEROSCOPY/STONE EXTRACTION WITH BASKET;  Surgeon: Ky BarbanMohammad I Javaid, MD;  Location: [facility];  Service: Urology;  Laterality: Left;  . HOLMIUM LASER APPLICATION N/A 05/09/2013   Procedure: HOLMIUM LASER APPLICATION;  Surgeon: Ky BarbanMohammad I Javaid, MD;  Location: [facility];  Service: Urology;  Laterality: N/A;  . INGUINAL HERNIA REPAIR Right 03/07/2015   Procedure: HERNIA REPAIR INGUINAL ADULT WITH MESH;  Surgeon: Franky MachoMark Jenkins Md, MD;  Location: [facility];  Service: General;  Laterality: Right;  . LITHOTRIPSY N/A 05/09/2013   Procedure: LITHOTRIPSY;  Surgeon: Ky BarbanMohammad I Javaid, MD;  Location: [facility];  Service: Urology;  Laterality: N/A;  . TONSILLECTOMY        Social History:     Social History   Tobacco Use  . Smoking status: Never Smoker  . Smokeless tobacco: Never Used  Substance Use Topics  . Alcohol use: No     Lives - at home  Mobility - walks by self   Family History :     Family History  Problem Relation Age of Onset  . Dementia Mother   . Other Father        perforated intestine  . Diabetes Brother   . Diabetes Sister   . Coronary artery disease Unknown   . Diabetes Unknown   . Hypertension Unknown      Home Medications:   Prior to Admission medications   Medication Sig Start Date End Date Taking? Authorizing Provider  aspirin EC 81 MG tablet Take 81 mg by mouth daily.   Yes [provider]  chlorthalidone (HYGROTON) 25 MG tablet Take 25 mg by mouth daily.  06/14/16  Yes [provider]  donepezil (ARICEPT) 10 MG tablet take 1 tablet at bedtime 03/25/17  Yes [provider]  doxepin (SINEQUAN) 10 MG capsule Take 10 mg by mouth at bedtime as needed (sleep).   Yes [provider]  finasteride (PROSCAR) 5 MG tablet Take 5 mg by mouth daily.   Yes [provider]  fluticasone (FLONASE) 50 MCG/ACT nasal spray Place 2 sprays into the nose daily as needed for allergies.    Yes [provider]  ibuprofen (ADVIL,MOTRIN) 800 MG tablet Take 800 mg by mouth every 8 (eight) hours as needed for moderate pain.   Yes [provider]  loratadine (CLARITIN) 10 MG tablet Take 10 mg by mouth daily as needed for allergies.    Yes [provider]  memantine (NAMENDA TITRATION PACK) tablet pack Take as directed per package instructions. Titration. Take 5mg  in the morning and 10mg  in the evenings. To increase to 20mg  daily on 11/21/2017 09/29/17  Yes [provider]  potassium citrate (UROCIT-K) 10 MEQ (1080 MG) SR tablet Take 10 mEq by mouth 2 (two) times daily.   Yes [provider]  Tamsulosin HCl (FLOMAX) 0.4 MG CAPS Take 0.4 mg by mouth daily.     Yes [provider]  zolpidem (AMBIEN) 10 MG tablet Take 10 mg by mouth at bedtime as needed for sleep.   Yes [provider]     Allergies:    No Known Allergies   Physical Exam:   Vitals  Blood pressure 137/74, pulse (!) 58, temperature 97.6 F (36.4 C), temperature source Oral, resp. rate 16, height 5\' 11"  (1.803 m), weight 65.8 kg (145 lb), SpO2 100 %.   1. General  lying in bed in NAD,    2. Normal affect and insight, Not Suicidal or Homicidal, Awake Alert, Oriented X 3.  3. No F.N deficits, ALL C.Nerves Intact, Strength 5/5 all 4 extremities, Sensation intact all 4 extremities, Plantars down going.  4. Ears and Eyes appear Normal, Conjunctivae clear, PERRLA. Moist Oral Mucosa.  5. Supple Neck, No JVD, No cervical lymphadenopathy appriciated, No Carotid Bruits.  6. Symmetrical Chest wall movement, Good air movement bilaterally, CTAB.  7. RRR, No Gallops, Rubs or Murmurs, No Parasternal Heave.  8. Positive Bowel Sounds, Abdomen Soft, No  tenderness, No organomegaly appriciated,No rebound -guarding or rigidity.  9.  No Cyanosis, Normal Skin Turgor, No Skin Rash or Bruise.  10. Good muscle tone,  joints appear normal , no effusions, Normal ROM.  11. No Palpable Lymph Nodes in Neck or Axillae     Data Review:    CBC Recent Labs  Lab 11/15/17 1932  WBC 7.7  HGB 13.8  HCT 41.0  PLT 231  MCV 96.9  MCH 32.6  MCHC 33.7  RDW 12.3  LYMPHSABS 1.3  MONOABS 0.6  EOSABS 0.0  BASOSABS 0.0   ------------------------------------------------------------------------------------------------------------------  Chemistries  Recent Labs  Lab 11/15/17 1932  NA 142  K 3.9  CL 101  CO2 29  GLUCOSE 117*  BUN 16  CREATININE 1.30*  CALCIUM 9.9   ------------------------------------------------------------------------------------------------------------------ estimated creatinine clearance is 43.6 mL/min (A) (by C-G formula based on SCr of 1.3 mg/dL (H)). ------------------------------------------------------------------------------------------------------------------ No results for input(s): TSH, T4TOTAL, T3FREE, THYROIDAB in the last 72 hours.  Invalid input(s): FREET3  Coagulation profile Recent Labs  Lab 11/15/17 1932  INR 1.05   ------------------------------------------------------------------------------------------------------------------- No results for input(s): DDIMER in the last 72 hours. -------------------------------------------------------------------------------------------------------------------  Cardiac Enzymes Recent Labs  Lab 11/15/17 1932  TROPONINI <0.03   ------------------------------------------------------------------------------------------------------------------ No results found for: BNP   ---------------------------------------------------------------------------------------------------------------  Urinalysis    Component Value Date/Time   COLORURINE YELLOW 11/15/2017  1932   APPEARANCEUR CLOUDY (A) 11/15/2017 1932   LABSPEC 1.008 11/15/2017 1932   PHURINE 8.0 11/15/2017 1932   GLUCOSEU NEGATIVE 11/15/2017 1932   HGBUR NEGATIVE 11/15/2017 1932   BILIRUBINUR NEGATIVE 11/15/2017 1932   KETONESUR NEGATIVE 11/15/2017 1932   PROTEINUR NEGATIVE 11/15/2017 1932   UROBILINOGEN 0.2 08/06/2014 0337   NITRITE NEGATIVE 11/15/2017 1932   LEUKOCYTESUR NEGATIVE 11/15/2017 1932    ----------------------------------------------------------------------------------------------------------------   Imaging Results:    Ct Head Wo Contrast  Result Date: 11/15/2017 CLINICAL DATA:  Altered level of consciousness. Headache and weakness. EXAM: CT HEAD WITHOUT CONTRAST TECHNIQUE: Contiguous axial images were obtained from the base of the skull through the vertex without intravenous contrast. COMPARISON:  Brain MRI 09/25/2013 FINDINGS: Brain: No evidence of acute infarction, hemorrhage, hydrocephalus, extra-axial collection or mass lesion/mass effect. Atrophy most notable in the medial temporal lobes. Chronic small vessel ischemia with confluent low-density in the periventricular white matter. Vascular: Atherosclerotic calcification. Skull: Negative Sinuses/Orbits: Negative IMPRESSION: 1. No acute finding. 2. Moderate atrophy most notable in the medial temporal lobes. Chronic small vessel ischemia. Electronically Signed   By: Marnee Spring M.D.   On: 11/15/2017 19:53   EKG NSR at 70, Q in 2, 3, Avf, no st-t changes c/w ischemia   Assessment & Plan:    Principal Problem:   TIA (transient ischemic attack)    Dizziness ? TIA vs starting namenda (3 weeks ago),   Tele Trop I q6h x3 MRI brain Carotid ultrasound Cardiac echo  ? Dementia Cont aricept  Cont namenda Cont Sinequan  DVT Prophylaxis  SCDs   AM Labs Ordered, also please review Full Orders  Family Communication: Admission, patients condition and plan of care including tests being ordered have been discussed  with the patient  who indicate understanding and agree with the plan and Code Status.  Code Status FULL CODE  Likely DC to  home  Condition GUARDED   Consults called: neurology consult for AM  Admission status:   inpatient   Time spent in minutes : 45   Pearson Grippe M.D on 11/15/2017 at 9:35 PM  Between 7am to 7pm - Pager - [phone]. After 7pm go to [email or web address] - password [facility]  [facility] - Office  [phone]

## 2017-11-15 NOTE — ED Notes (Signed)
POC CBG 95. 

## 2017-11-15 NOTE — ED Notes (Signed)
Pt states sudden onset of weakness/ dizziness

## 2017-11-16 ENCOUNTER — Inpatient Hospital Stay (HOSPITAL_COMMUNITY): Payer: Medicare Other

## 2017-11-16 ENCOUNTER — Other Ambulatory Visit: Payer: Self-pay

## 2017-11-16 ENCOUNTER — Encounter (HOSPITAL_COMMUNITY): Payer: Self-pay | Admitting: *Deleted

## 2017-11-16 DIAGNOSIS — I361 Nonrheumatic tricuspid (valve) insufficiency: Secondary | ICD-10-CM

## 2017-11-16 DIAGNOSIS — R42 Dizziness and giddiness: Secondary | ICD-10-CM

## 2017-11-16 DIAGNOSIS — G459 Transient cerebral ischemic attack, unspecified: Principal | ICD-10-CM

## 2017-11-16 LAB — ECHOCARDIOGRAM COMPLETE
CHL CUP MV DEC (S): 250
E/e' ratio: 6.08
EWDT: 250 ms
FS: 37 % (ref 28–44)
HEIGHTINCHES: 69 in
IV/PV OW: 0.95
LA ID, A-P, ES: 30 mm
LA diam end sys: 30 mm
LA vol index: 13 mL/m2
LA vol: 23.2 mL
LADIAMINDEX: 1.68 cm/m2
LAVOLA4C: 25.4 mL
LV E/e' medial: 6.08
LV SIMPSON'S DISK: 68
LV TDI E'LATERAL: 8.59
LV TDI E'MEDIAL: 6.64
LV dias vol index: 48 mL/m2
LV dias vol: 85 mL (ref 62–150)
LV e' LATERAL: 8.59 cm/s
LV sys vol: 27 mL (ref 21–61)
LVEEAVG: 6.08
LVOT SV: 42 mL
LVOT VTI: 14.7 cm
LVOT area: 2.84 cm2
LVOT diameter: 19 mm
LVOT peak grad rest: 3 mmHg
LVOT peak vel: 79.5 cm/s
LVSYSVOLIN: 15 mL/m2
Lateral S' vel: 16 cm/s
MVPKAVEL: 83.3 m/s
MVPKEVEL: 52.2 m/s
PW: 9.89 mm — AB (ref 0.6–1.1)
RV sys press: 26 mmHg
Reg peak vel: 211 cm/s
Stroke v: 58 ml
TAPSE: 22.9 mm
TRMAXVEL: 211 cm/s
WEIGHTICAEL: 2320 [oz_av]

## 2017-11-16 LAB — COMPREHENSIVE METABOLIC PANEL
ALK PHOS: 45 U/L (ref 38–126)
ALT: 18 U/L (ref 17–63)
AST: 27 U/L (ref 15–41)
Albumin: 4 g/dL (ref 3.5–5.0)
Anion gap: 9 (ref 5–15)
BUN: 15 mg/dL (ref 6–20)
CO2: 27 mmol/L (ref 22–32)
CREATININE: 1.2 mg/dL (ref 0.61–1.24)
Calcium: 9.3 mg/dL (ref 8.9–10.3)
Chloride: 99 mmol/L — ABNORMAL LOW (ref 101–111)
GFR calc non Af Amer: 56 mL/min — ABNORMAL LOW (ref >60)
GLUCOSE: 135 mg/dL — AB (ref 65–99)
Potassium: 3.8 mmol/L (ref 3.5–5.1)
SODIUM: 135 mmol/L (ref 135–145)
Total Bilirubin: 1.1 mg/dL (ref 0.3–1.2)
Total Protein: 6.7 g/dL (ref 6.5–8.1)

## 2017-11-16 LAB — CBC
HCT: 37.6 % — ABNORMAL LOW (ref 39.0–52.0)
HEMOGLOBIN: 12.6 g/dL — AB (ref 13.0–17.0)
MCH: 32.2 pg (ref 26.0–34.0)
MCHC: 33.5 g/dL (ref 30.0–36.0)
MCV: 96.2 fL (ref 78.0–100.0)
PLATELETS: 222 10*3/uL (ref 150–400)
RBC: 3.91 MIL/uL — AB (ref 4.22–5.81)
RDW: 12.3 % (ref 11.5–15.5)
WBC: 7.7 10*3/uL (ref 4.0–10.5)

## 2017-11-16 LAB — LIPID PANEL
CHOL/HDL RATIO: 3 ratio
Cholesterol: 197 mg/dL (ref 0–200)
HDL: 66 mg/dL (ref >40)
LDL CALC: 120 mg/dL — AB (ref 0–99)
Triglycerides: 55 mg/dL (ref <150)
VLDL: 11 mg/dL (ref 0–40)

## 2017-11-16 LAB — HEMOGLOBIN A1C
Hgb A1c MFr Bld: 5.5 % (ref 4.8–5.6)
Mean Plasma Glucose: 111.15 mg/dL

## 2017-11-16 LAB — TSH: TSH: 1.518 u[IU]/mL (ref 0.350–4.500)

## 2017-11-16 MED ORDER — CHLORTHALIDONE 25 MG PO TABS
25.0000 mg | ORAL_TABLET | Freq: Every day | ORAL | Status: DC
  Administered 2017-11-16 – 2017-11-17 (×2): 25 mg via ORAL
  Filled 2017-11-16 (×3): qty 1

## 2017-11-16 MED ORDER — STROKE: EARLY STAGES OF RECOVERY BOOK
Freq: Once | Status: AC
  Administered 2017-11-16: 09:00:00

## 2017-11-16 MED ORDER — FINASTERIDE 5 MG PO TABS
5.0000 mg | ORAL_TABLET | Freq: Every day | ORAL | Status: DC
  Administered 2017-11-16 – 2017-11-17 (×2): 5 mg via ORAL
  Filled 2017-11-16 (×3): qty 1

## 2017-11-16 MED ORDER — DONEPEZIL HCL 5 MG PO TABS
10.0000 mg | ORAL_TABLET | Freq: Every day | ORAL | Status: DC
  Administered 2017-11-16 (×2): 10 mg via ORAL
  Filled 2017-11-16 (×3): qty 2

## 2017-11-16 MED ORDER — MEMANTINE HCL 10 MG PO TABS
5.0000 mg | ORAL_TABLET | Freq: Two times a day (BID) | ORAL | Status: DC
  Administered 2017-11-16: 5 mg via ORAL
  Filled 2017-11-16: qty 1

## 2017-11-16 MED ORDER — ACETAMINOPHEN 650 MG RE SUPP: 650.0000 mg | RECTAL | Status: DC | PRN

## 2017-11-16 MED ORDER — LORATADINE 10 MG PO TABS: 10.0000 mg | ORAL_TABLET | Freq: Every day | ORAL | Status: DC | PRN

## 2017-11-16 MED ORDER — POTASSIUM CITRATE ER 10 MEQ (1080 MG) PO TBCR
10.0000 meq | EXTENDED_RELEASE_TABLET | Freq: Two times a day (BID) | ORAL | Status: DC
  Administered 2017-11-16 – 2017-11-17 (×3): 10 meq via ORAL
  Filled 2017-11-16 (×5): qty 1

## 2017-11-16 MED ORDER — IBUPROFEN 400 MG PO TABS: 400.0000 mg | ORAL_TABLET | Freq: Four times a day (QID) | ORAL | Status: DC | PRN

## 2017-11-16 MED ORDER — MEMANTINE HCL 28 X 5 MG & 21 X 10 MG PO TABS: ORAL_TABLET | ORAL | Status: DC

## 2017-11-16 MED ORDER — SODIUM CHLORIDE 0.9 % IV SOLN
INTRAVENOUS | Status: AC
  Administered 2017-11-16: via INTRAVENOUS

## 2017-11-16 MED ORDER — ACETAMINOPHEN 325 MG PO TABS
650.0000 mg | ORAL_TABLET | ORAL | Status: DC | PRN
  Administered 2017-11-16 (×2): 650 mg via ORAL
  Filled 2017-11-16 (×2): qty 2

## 2017-11-16 MED ORDER — ENOXAPARIN SODIUM 40 MG/0.4ML ~~LOC~~ SOLN
40.0000 mg | SUBCUTANEOUS | Status: DC
  Administered 2017-11-16: 40 mg via SUBCUTANEOUS
  Filled 2017-11-16: qty 0.4

## 2017-11-16 MED ORDER — HALOPERIDOL LACTATE 5 MG/ML IJ SOLN
1.0000 mg | Freq: Once | INTRAMUSCULAR | Status: AC
  Administered 2017-11-17: 1 mg via INTRAVENOUS
  Filled 2017-11-16: qty 1

## 2017-11-16 MED ORDER — DOXEPIN HCL 10 MG PO CAPS
10.0000 mg | ORAL_CAPSULE | Freq: Every evening | ORAL | Status: DC | PRN
  Administered 2017-11-16: 10 mg via ORAL
  Filled 2017-11-16 (×3): qty 1

## 2017-11-16 MED ORDER — FLUTICASONE PROPIONATE 50 MCG/ACT NA SUSP
2.0000 | Freq: Every day | NASAL | Status: DC
  Administered 2017-11-16 – 2017-11-17 (×2): 2 via NASAL
  Filled 2017-11-16: qty 16

## 2017-11-16 MED ORDER — ACETAMINOPHEN 160 MG/5ML PO SOLN: 650.0000 mg | ORAL | Status: DC | PRN

## 2017-11-16 MED ORDER — ASPIRIN EC 81 MG PO TBEC
81.0000 mg | DELAYED_RELEASE_TABLET | Freq: Every day | ORAL | Status: DC
  Administered 2017-11-16 – 2017-11-17 (×2): 81 mg via ORAL
  Filled 2017-11-16 (×2): qty 1

## 2017-11-16 MED ORDER — TAMSULOSIN HCL 0.4 MG PO CAPS
0.4000 mg | ORAL_CAPSULE | Freq: Every day | ORAL | Status: DC
  Administered 2017-11-16 – 2017-11-17 (×2): 0.4 mg via ORAL
  Filled 2017-11-16 (×2): qty 1

## 2017-11-16 NOTE — Progress Notes (Signed)
*  PRELIMINARY RESULTS* Echocardiogram 2D Echocardiogram has been performed.  Stacey DrainWhite, Tillmon Kisling J 11/16/2017, 4:13 PM

## 2017-11-16 NOTE — Progress Notes (Signed)
PROGRESS NOTE    Jacob Fleming  ZOX:[id number] DOB: 04/23/1941 DOA: 11/15/2017 PCP: Gareth Morgan, MD     Brief Narrative:  77 year old man admitted from home on 1/22 due to headache, dizziness as well as gait imbalance.  The symptoms resolved after arrival to the ED.  MRI is negative for CVA, has been admitted for TIA workup.   Assessment & Plan:   Principal Problem:   TIA (transient ischemic attack) Active Problems:   Dizziness   TIA -Other than headache, his symptoms have resolved, currently has no focal neurologic deficits. -MRI is negative for CVA. 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 -Have requested neurology consultation for further recommendations in light of above findings. -2D echo done with results pending. -Carotid Dopplers: Less than 50% stenosis on the left, antegrade bilateral vertebral arterial flow. -Continue aspirin for secondary stroke prevention. -PT seen with recommendations for no further PT follow-up.  Dementia -Continue Aricept, Namenda, Sinequan.   DVT prophylaxis: Lovenox Code Status: Full code Family Communication: Daughter and brother at bedside updated on plan of care and questions answered Disposition Plan: Anticipate discharge home in 24 hours pending neurology recommendations  Consultants:   Neurology, pending  Procedures:   2D echo pending  Carotid Dopplers as above  Antimicrobials:  Anti-infectives (From admission, onward)   None       Subjective: Complains of occipital headache, gait imbalance and dizziness have resolved  Objective: Vitals:   11/16/17 0610 11/16/17 0810 11/16/17 1010 11/16/17 1210  BP: (!) 142/72 140/75 (!) 147/59 127/66  Pulse: 93 92 67 77  Resp: 16 18 18 18   Temp: 98 F (36.7 C) 98.4 F (36.9 C) 98.5 F (36.9 C) 98.3 F (36.8 C)  TempSrc: Oral Oral    SpO2: 99% 100% 100% 100%  Weight:      Height:        Intake/Output Summary (Last 24 hours) at 11/16/2017 1647 Last  data filed at 11/16/2017 1300 Gross per 24 hour  Intake 597.5 ml  Output -  Net 597.5 ml   Filed Weights   11/15/17 1912  Weight: 65.8 kg (145 lb)  25 minutes  Examination:  General exam: Alert, awake, oriented x 3, forgetful at times Respiratory system: Clear to auscultation. Respiratory effort normal. Cardiovascular system:RRR. No murmurs, rubs, gallops. Gastrointestinal system: Abdomen is nondistended, soft and nontender. No organomegaly or masses felt. Normal bowel sounds heard. Central nervous system: Alert and oriented. No focal neurological deficits. Extremities: No C/C/E, +pedal pulses Skin: No rashes, lesions or ulcers Psychiatry: Judgement and insight appear normal. Mood & affect appropriate.     Data Reviewed: I have personally reviewed following labs and imaging studies  CBC: Recent Labs  Lab 11/15/17 1932 11/16/17 0901  WBC 7.7 7.7  NEUTROABS 5.8  --   HGB 13.8 12.6*  HCT 41.0 37.6*  MCV 96.9 96.2  PLT 231 222   Basic Metabolic Panel: Recent Labs  Lab 11/15/17 1932 11/16/17 0901  NA 142 135  K 3.9 3.8  CL 101 99*  CO2 29 27  GLUCOSE 117* 135*  BUN 16 15  CREATININE 1.30* 1.20  CALCIUM 9.9 9.3   GFR: Estimated Creatinine Clearance: 47.2 mL/min (by C-G formula based on SCr of 1.2 mg/dL). Liver Function Tests: Recent Labs  Lab 11/16/17 0901  AST 27  ALT 18  ALKPHOS 45  BILITOT 1.1  PROT 6.7  ALBUMIN 4.0   No results for input(s): LIPASE, AMYLASE in the last 168 hours. No  results for input(s): AMMONIA in the last 168 hours. Coagulation Profile: Recent Labs  Lab 11/15/17 1932  INR 1.05   Cardiac Enzymes: Recent Labs  Lab 11/15/17 1932  TROPONINI <0.03   BNP (last 3 results) No results for input(s): PROBNP in the last 8760 hours. HbA1C: No results for input(s): HGBA1C in the last 72 hours. CBG: Recent Labs  Lab 11/15/17 1951  GLUCAP 95   Lipid Profile: Recent Labs    11/16/17 0901  CHOL 197  HDL 66  LDLCALC 120*    TRIG 55  CHOLHDL 3.0   Thyroid Function Tests: No results for input(s): TSH, T4TOTAL, FREET4, T3FREE, THYROIDAB in the last 72 hours. Anemia Panel: No results for input(s): VITAMINB12, FOLATE, FERRITIN, TIBC, IRON, RETICCTPCT in the last 72 hours. Urine analysis:    Component Value Date/Time   COLORURINE YELLOW 11/15/2017 1932   APPEARANCEUR CLOUDY (A) 11/15/2017 1932   LABSPEC 1.008 11/15/2017 1932   PHURINE 8.0 11/15/2017 1932   GLUCOSEU NEGATIVE 11/15/2017 1932   HGBUR NEGATIVE 11/15/2017 1932   BILIRUBINUR NEGATIVE 11/15/2017 1932   KETONESUR NEGATIVE 11/15/2017 1932   PROTEINUR NEGATIVE 11/15/2017 1932   UROBILINOGEN 0.2 08/06/2014 0337   NITRITE NEGATIVE 11/15/2017 1932   LEUKOCYTESUR NEGATIVE 11/15/2017 1932   Sepsis Labs: @LABRCNTIP (procalcitonin:4,lacticidven:4)  )No results found for this or any previous visit (from the past 240 hour(s)).       Radiology Studies: Dg Chest 2 View  Result Date: 11/15/2017 CLINICAL DATA:  Initial evaluation for acute dizziness with weakness. History of hypertension. EXAM: CHEST  2 VIEW COMPARISON:  Prior CT from 02/26/2009. FINDINGS: The cardiac and mediastinal silhouettes are stable in size and contour, and remain within normal limits. Aortic atherosclerosis. The lungs are normally inflated. No airspace consolidation, pleural effusion, or pulmonary edema is identified. There is no pneumothorax. No acute osseous abnormality identified. IMPRESSION: No active cardiopulmonary disease. Electronically Signed   By: Rise Mu M.D.   On: 11/15/2017 22:57   Ct Head Wo Contrast  Result Date: 11/15/2017 CLINICAL DATA:  Altered level of consciousness. Headache and weakness. EXAM: CT HEAD WITHOUT CONTRAST TECHNIQUE: Contiguous axial images were obtained from the base of the skull through the vertex without intravenous contrast. COMPARISON:  Brain MRI 09/25/2013 FINDINGS: Brain: No evidence of acute infarction, hemorrhage, hydrocephalus,  extra-axial collection or mass lesion/mass effect. Atrophy most notable in the medial temporal lobes. Chronic small vessel ischemia with confluent low-density in the periventricular white matter. Vascular: Atherosclerotic calcification. Skull: Negative Sinuses/Orbits: Negative IMPRESSION: 1. No acute finding. 2. Moderate atrophy most notable in the medial temporal lobes. Chronic small vessel ischemia. Electronically Signed   By: Marnee Spring M.D.   On: 11/15/2017 19:53   Mr Brain Wo Contrast  Result Date: 11/16/2017 CLINICAL DATA:  Headache and weakness.  Unsteady gait.  TIA. EXAM: MRI HEAD WITHOUT CONTRAST MRA HEAD WITHOUT CONTRAST TECHNIQUE: Multiplanar, multiecho pulse sequences of the brain and surrounding structures were obtained without intravenous contrast. Angiographic images of the head were obtained using MRA technique without contrast. COMPARISON:  CT head without contrast 11/15/2017. MRI brain 09/25/2013. The. FINDINGS: MRI HEAD FINDINGS Brain: Advanced atrophy and white matter disease is present bilaterally. Ventricles are proportionate to the degree of atrophy. White matter changes extend into the brainstem. No acute infarct, hemorrhage, or mass lesion is present. No significant extra-axial fluid collection is present. Vascular: Flow is present in the major intracranial arteries. The globes and orbits are within normal limits. Skull and upper cervical spine: The skull base  is within normal limits. Craniocervical junction is normal. Midline sagittal structures are unremarkable. Sinuses/Orbits: The paranasal sinuses and mastoid air cells are clear. Globes and orbits are within normal limits. MRA HEAD FINDINGS The cervical left ICA is tortuous without focal stenosis. There is mild irregularity through the cavernous segments bilaterally. The A1 and M1 segments are normal. MCA bifurcations are intact. ACA and MCA branch vessels are within normal limits. Left vertebral artery is the dominant vessel.  The right PICA origin is visualized and normal. The left PICA is not visualized. The basilar artery is normal. Both posterior cerebral arteries originate from the basilar tip. 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 More distal segmental narrowing is present on the right. IMPRESSION: 1. No acute intracranial abnormality. 2. Advanced atrophy and white matter disease. This likely reflects the sequela of chronic microvascular ischemia. 3. 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 4. Tortuosity of the cervical left ICA without significant stenosis. This is nonspecific, but often seen in the setting of hypertension. Electronically Signed   By: Marin Robertshristopher  Mattern M.D.   On: 11/16/2017 08:33   [place] Carotid Bilateral (at [facility] Only)  Result Date: 11/16/2017 CLINICAL DATA:  Dizzy, possible TIA.  Hypertension, hyperlipidemia. EXAM: BILATERAL CAROTID DUPLEX ULTRASOUND TECHNIQUE: Wallace CullensGray scale imaging, color Doppler and duplex ultrasound was performed of bilateral carotid and vertebral arteries in the neck. COMPARISON:  None. TECHNIQUE: Quantification of carotid stenosis is based on velocity parameters that correlate the residual internal carotid diameter with NASCET-based stenosis levels, using the diameter of the distal internal carotid lumen as the denominator for stenosis measurement. The following velocity measurements were obtained: PEAK SYSTOLIC/END DIASTOLIC RIGHT ICA:                     74/14cm/sec CCA:                     101/10cm/sec SYSTOLIC ICA/CCA RATIO:  0.7 DIASTOLIC ICA/CCA RATIO: 1.3 ECA:                     94cm/sec LEFT ICA:                     76/18cm/sec CCA:                     109/15cm/sec SYSTOLIC ICA/CCA RATIO:  0.7 DIASTOLIC ICA/CCA RATIO: 1.2 ECA:                     122cm/sec FINDINGS: RIGHT CAROTID ARTERY: No significant plaque deposition or stenosis. Normal waveforms and color Doppler  signal. Distal ICA mildly tortuous. RIGHT VERTEBRAL ARTERY: Normal flow direction and waveform. A nonspecific cardiac arrhythmia is noted. LEFT CAROTID ARTERY: Intimal thickening through the common carotid artery. Minimal eccentric nonocclusive plaque in the proximal ICA. No high-grade stenosis. Normal waveforms and color Doppler signal. LEFT VERTEBRAL ARTERY: Normal flow direction and waveform. IMPRESSION: 1. Early left proximal ICA plaque resulting in less than 50% diameter stenosis. 2.  Antegrade bilateral vertebral arterial flow. Electronically Signed   By: Corlis Leak  Hassell M.D.   On: 11/16/2017 09:09   Mr Maxine GlennMra Head/brain NWWo Cm  Result Date: 11/16/2017 CLINICAL DATA:  Headache and weakness.  Unsteady gait.  TIA. EXAM: MRI HEAD WITHOUT CONTRAST MRA HEAD WITHOUT CONTRAST TECHNIQUE: Multiplanar, multiecho pulse sequences of the brain and surrounding  structures were obtained without intravenous contrast. Angiographic images of the head were obtained using MRA technique without contrast. COMPARISON:  CT head without contrast 11/15/2017. MRI brain 09/25/2013. The. FINDINGS: MRI HEAD FINDINGS Brain: Advanced atrophy and white matter disease is present bilaterally. Ventricles are proportionate to the degree of atrophy. White matter changes extend into the brainstem. No acute infarct, hemorrhage, or mass lesion is present. No significant extra-axial fluid collection is present. Vascular: Flow is present in the major intracranial arteries. The globes and orbits are within normal limits. Skull and upper cervical spine: The skull base is within normal limits. Craniocervical junction is normal. Midline sagittal structures are unremarkable. Sinuses/Orbits: The paranasal sinuses and mastoid air cells are clear. Globes and orbits are within normal limits. MRA HEAD FINDINGS The cervical left ICA is tortuous without focal stenosis. There is mild irregularity through the cavernous segments bilaterally. The A1 and M1 segments are  normal. MCA bifurcations are intact. ACA and MCA branch vessels are within normal limits. Left vertebral artery is the dominant vessel. The right PICA origin is visualized and normal. The left PICA is not visualized. The basilar artery is normal. Both posterior cerebral arteries originate from the basilar tip. 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 More distal segmental narrowing is present on the right. IMPRESSION: 1. No acute intracranial abnormality. 2. Advanced atrophy and white matter disease. This likely reflects the sequela of chronic microvascular ischemia. 3. 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 4. Tortuosity of the cervical left ICA without significant stenosis. This is nonspecific, but often seen in the setting of hypertension. Electronically Signed   By: Marin Roberts M.D.   On: 11/16/2017 08:33        Scheduled Meds: . aspirin EC  81 mg Oral Daily  . chlorthalidone  25 mg Oral Daily  . donepezil  10 mg Oral QHS  . finasteride  5 mg Oral Daily  . fluticasone  2 spray Each Nare Daily  . memantine  5 mg Oral BID  . potassium citrate  10 mEq Oral BID  . tamsulosin  0.4 mg Oral Daily   Continuous Infusions:   LOS: 1 day    Time spent: 25 minutes. Greater than 50% of this time was spent in direct contact with the patient coordinating care.     Chaya Jan, MD [facility] Pager [phone]  If 7PM-7AM, please contact night-coverage [email or web address] Password [facility] 11/16/2017, 4:47 PM

## 2017-11-16 NOTE — Consult Note (Addendum)
[place] A. Merlene Laughter, MD     [email or web address]          Jacob Fleming is an 77 y.o. male.   ASSESSMENT/PLAN: 1.  Generalized fatigue and weakness of unclear etiology:  The addition of Namenda may be the  Culprit.  I will recommend discontinuing this medication.  Additional labs will be obtained.  2.   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  EEG will be obtained.  Additional labs will also be obtained.  3. Baseline Dementia:      The patient is a 77 year old white male who has a baseline history of dementia.  He her apparently remains quite functional.  Lives by himself although his daughter lives nearby and helps out.  He does all his ADLs by himself.  He continues to drive, sharps in take care of his financial affairs.  The daughter tells me that most of his bills are set up on electronic Draft.  The patient is college educated and has a BS degree from [employer].  It appears that he has been on Aricept for quite awhile.  Namenda was added about three weeks ago.  The daughter reports that his global fatigue and does not feeling well half brothers acutely over the last few days.  They do not report any constitutional symptoms such as fever chills.  There are no reports of coughing or other issues.    GENERAL: Some what restless  HEENT: Normal  ABDOMEN: soft  EXTREMITIES: No edema   BACK: normal  SKIN: Normal by inspection.    MENTAL STATUS:   He is awake and alert.  He knows he is in the hospital but he is not oriented to time.  He also stated that the current president is Bush.  He has some significant obvious short-term memory impairment and is quite tangential.  CRANIAL NERVES: Pupils are equal, round and reactive to light and accomodation; extra ocular movements are full, there is no significant nystagmus; visual fields are full; upper and lower facial muscles are normal in strength and symmetric, there  is no flattening of the nasolabial folds; tongue is midline; uvula is midline; shoulder elevation is normal.  MOTOR: Normal tone, bulk and strength; no pronator drift.  COORDINATION: Left finger to nose is normal, right finger to nose is normal, No rest tremor; no intention tremor; no postural tremor; no bradykinesia.  REFLEXES: Deep tendon reflexes are symmetrical and normal. Plantar reflexes are flexor bilaterally.   SENSATION: Normal to light touch, temperature, and pinprick.  GAIT: Normal.      Blood pressure 127/66, pulse 77, temperature 98.3 F (36.8 C), resp. rate 18, height '5\' 9"'$  (1.753 m), weight 145 lb (65.8 kg), SpO2 100 %.  Past Medical History:  Diagnosis Date  . Anxiety   . BPH (benign prostatic hyperplasia)   . History of kidney stones   . Hyperlipidemia   . Hypertension     Past Surgical History:  Procedure Laterality Date  . APPENDECTOMY    . BACK SURGERY     L5-L6  . CYSTOSCOPY W/ RETROGRADES     pyelogram  . CYSTOSCOPY/RETROGRADE/URETEROSCOPY/STONE EXTRACTION WITH BASKET Left 05/09/2013   Procedure: CYSTOSCOPY/LEFT RETROGRADE/URETEROSCOPY/STONE EXTRACTION WITH BASKET;  Surgeon: Marissa Nestle, MD;  Location: [facility];  Service: Urology;  Laterality: Left;  . HOLMIUM LASER APPLICATION N/A 2/35/3614   Procedure: HOLMIUM LASER APPLICATION;  Surgeon: Marissa Nestle, MD;  Location: [facility];  Service:  Urology;  Laterality: N/A;  . INGUINAL HERNIA REPAIR Right 03/07/2015   Procedure: HERNIA REPAIR INGUINAL ADULT WITH MESH;  Surgeon: Aviva Signs Md, MD;  Location: [facility];  Service: General;  Laterality: Right;  . LITHOTRIPSY N/A 05/09/2013   Procedure: LITHOTRIPSY;  Surgeon: Marissa Nestle, MD;  Location: [facility];  Service: Urology;  Laterality: N/A;  . TONSILLECTOMY      Family History  Problem Relation Age of Onset  . Dementia Mother   . Other Father        perforated intestine  . Diabetes Brother   . Diabetes Sister   . Coronary artery disease  Unknown   . Diabetes Unknown   . Hypertension Unknown     Social History:  reports that  has never smoked. he has never used smokeless tobacco. He reports that he does not drink alcohol or use drugs.  Allergies: No Known Allergies  Medications: Prior to Admission medications   Medication Sig Start Date End Date Taking? Authorizing Provider  aspirin EC 81 MG tablet Take 81 mg by mouth daily.   Yes [provider]  chlorthalidone (HYGROTON) 25 MG tablet Take 25 mg by mouth daily.  06/14/16  Yes [provider]  donepezil (ARICEPT) 10 MG tablet take 1 tablet at bedtime 03/25/17  Yes [provider]  doxepin (SINEQUAN) 10 MG capsule Take 10 mg by mouth at bedtime as needed (sleep).   Yes [provider]  finasteride (PROSCAR) 5 MG tablet Take 5 mg by mouth daily.   Yes [provider]  fluticasone (FLONASE) 50 MCG/ACT nasal spray Place 2 sprays into the nose daily as needed for allergies.    Yes [provider]  ibuprofen (ADVIL,MOTRIN) 800 MG tablet Take 800 mg by mouth every 8 (eight) hours as needed for moderate pain.   Yes [provider]  loratadine (CLARITIN) 10 MG tablet Take 10 mg by mouth daily as needed for allergies.    Yes [provider]  memantine (NAMENDA TITRATION PACK) tablet pack Take as directed per package instructions. Titration. Take '5mg'$  in the morning and '10mg'$  in the evenings. To increase to '20mg'$  daily on 11/21/2017 09/29/17  Yes [provider]  potassium citrate (UROCIT-K) 10 MEQ (1080 MG) SR tablet Take 10 mEq by mouth 2 (two) times daily.   Yes [provider]  Tamsulosin HCl (FLOMAX) 0.4 MG CAPS Take 0.4 mg by mouth daily.     Yes [provider]  zolpidem (AMBIEN) 10 MG tablet Take 10 mg by mouth at bedtime as needed for sleep.   Yes [provider]    Scheduled Meds: . aspirin EC  81 mg Oral Daily  . chlorthalidone  25 mg Oral Daily  . donepezil  10 mg Oral  QHS  . enoxaparin (LOVENOX) injection  40 mg Subcutaneous Q24H  . finasteride  5 mg Oral Daily  . fluticasone  2 spray Each Nare Daily  . memantine  5 mg Oral BID  . potassium citrate  10 mEq Oral BID  . tamsulosin  0.4 mg Oral Daily   Continuous Infusions: PRN Meds:.acetaminophen **OR** acetaminophen (TYLENOL) oral liquid 160 mg/5 mL **OR** acetaminophen, doxepin, ibuprofen, loratadine     Results for orders placed or performed during the hospital encounter of 11/15/17 (from the past 48 hour(s))  Basic metabolic panel     Status: Abnormal   Collection Time: 11/15/17  7:32 PM  Result Value Ref Range   Sodium 142 135 - 145  mmol/L   Potassium 3.9 3.5 - 5.1 mmol/L   Chloride 101 101 - 111 mmol/L   CO2 29 22 - 32 mmol/L   Glucose, Bld 117 (H) 65 - 99 mg/dL   BUN 16 6 - 20 mg/dL   Creatinine, Ser 1.30 (H) 0.61 - 1.24 mg/dL   Calcium 9.9 8.9 - 10.3 mg/dL   GFR calc non Af Amer 51 (L) >60 mL/min   GFR calc Af Amer 59 (L) >60 mL/min    Comment: (NOTE) The eGFR has been calculated using the CKD EPI equation. This calculation has not been validated in all clinical situations. eGFR's persistently <60 mL/min signify possible Chronic Kidney Disease.    Anion gap 12 5 - 15  CBC with Differential     Status: None   Collection Time: 11/15/17  7:32 PM  Result Value Ref Range   WBC 7.7 4.0 - 10.5 K/uL   RBC 4.23 4.22 - 5.81 MIL/uL   Hemoglobin 13.8 13.0 - 17.0 g/dL   HCT 41.0 39.0 - 52.0 %   MCV 96.9 78.0 - 100.0 fL   MCH 32.6 26.0 - 34.0 pg   MCHC 33.7 30.0 - 36.0 g/dL   RDW 12.3 11.5 - 15.5 %   Platelets 231 150 - 400 K/uL   Neutrophils Relative % 76 %   Neutro Abs 5.8 1.7 - 7.7 K/uL   Lymphocytes Relative 17 %   Lymphs Abs 1.3 0.7 - 4.0 K/uL   Monocytes Relative 7 %   Monocytes Absolute 0.6 0.1 - 1.0 K/uL   Eosinophils Relative 0 %   Eosinophils Absolute 0.0 0.0 - 0.7 K/uL   Basophils Relative 0 %   Basophils Absolute 0.0 0.0 - 0.1 K/uL  Protime-INR     Status: None    Collection Time: 11/15/17  7:32 PM  Result Value Ref Range   Prothrombin Time 13.6 11.4 - 15.2 seconds   INR 1.05   APTT     Status: None   Collection Time: 11/15/17  7:32 PM  Result Value Ref Range   aPTT 30 24 - 36 seconds  Urinalysis, Routine w reflex microscopic     Status: Abnormal   Collection Time: 11/15/17  7:32 PM  Result Value Ref Range   Color, Urine YELLOW YELLOW   APPearance CLOUDY (A) CLEAR   Specific Gravity, Urine 1.008 1.005 - 1.030   pH 8.0 5.0 - 8.0   Glucose, UA NEGATIVE NEGATIVE mg/dL   Hgb urine dipstick NEGATIVE NEGATIVE   Bilirubin Urine NEGATIVE NEGATIVE   Ketones, ur NEGATIVE NEGATIVE mg/dL   Protein, ur NEGATIVE NEGATIVE mg/dL   Nitrite NEGATIVE NEGATIVE   Leukocytes, UA NEGATIVE NEGATIVE  Troponin I     Status: None   Collection Time: 11/15/17  7:32 PM  Result Value Ref Range   Troponin I <0.03 <0.03 ng/mL  POC CBG, ED     Status: None   Collection Time: 11/15/17  7:51 PM  Result Value Ref Range   Glucose-Capillary 95 65 - 99 mg/dL  Lipid panel     Status: Abnormal   Collection Time: 11/16/17  9:01 AM  Result Value Ref Range   Cholesterol 197 0 - 200 mg/dL   Triglycerides 55 <150 mg/dL   HDL 66 >40 mg/dL   Total CHOL/HDL Ratio 3.0 RATIO   VLDL 11 0 - 40 mg/dL   LDL Cholesterol 120 (H) 0 - 99 mg/dL    Comment:        Total Cholesterol/HDL:CHD Risk  Coronary Heart Disease Risk Table                     Men   Women  1/2 Average Risk   3.4   3.3  Average Risk       5.0   4.4  2 X Average Risk   9.6   7.1  3 X Average Risk  23.4   11.0        Use the calculated Patient Ratio above and the CHD Risk Table to determine the patient's CHD Risk.        ATP III CLASSIFICATION (LDL):  <100     mg/dL   Optimal  100-129  mg/dL   Near or Above                    Optimal  130-159  mg/dL   Borderline  160-189  mg/dL   High  >190     mg/dL   Very High   CBC     Status: Abnormal   Collection Time: 11/16/17  9:01 AM  Result Value Ref Range   WBC  7.7 4.0 - 10.5 K/uL   RBC 3.91 (L) 4.22 - 5.81 MIL/uL   Hemoglobin 12.6 (L) 13.0 - 17.0 g/dL   HCT 37.6 (L) 39.0 - 52.0 %   MCV 96.2 78.0 - 100.0 fL   MCH 32.2 26.0 - 34.0 pg   MCHC 33.5 30.0 - 36.0 g/dL   RDW 12.3 11.5 - 15.5 %   Platelets 222 150 - 400 K/uL  Comprehensive metabolic panel     Status: Abnormal   Collection Time: 11/16/17  9:01 AM  Result Value Ref Range   Sodium 135 135 - 145 mmol/L    Comment: DELTA CHECK NOTED   Potassium 3.8 3.5 - 5.1 mmol/L   Chloride 99 (L) 101 - 111 mmol/L   CO2 27 22 - 32 mmol/L   Glucose, Bld 135 (H) 65 - 99 mg/dL   BUN 15 6 - 20 mg/dL   Creatinine, Ser 1.20 0.61 - 1.24 mg/dL   Calcium 9.3 8.9 - 10.3 mg/dL   Total Protein 6.7 6.5 - 8.1 g/dL   Albumin 4.0 3.5 - 5.0 g/dL   AST 27 15 - 41 U/L   ALT 18 17 - 63 U/L   Alkaline Phosphatase 45 38 - 126 U/L   Total Bilirubin 1.1 0.3 - 1.2 mg/dL   GFR calc non Af Amer 56 (L) >60 mL/min   GFR calc Af Amer >60 >60 mL/min    Comment: (NOTE) The eGFR has been calculated using the CKD EPI equation. This calculation has not been validated in all clinical situations. eGFR's persistently <60 mL/min signify possible Chronic Kidney Disease.    Anion gap 9 5 - 15    Studies/Results:    MRI/MRA BRAIN FINDINGS: MRI HEAD FINDINGS  Brain: Advanced atrophy and white matter disease is present bilaterally. Ventricles are proportionate to the degree of atrophy. White matter changes extend into the brainstem. No acute infarct, hemorrhage, or mass lesion is present. No significant extra-axial fluid collection is present.  Vascular: Flow is present in the major intracranial arteries. The globes and orbits are within normal limits.  Skull and upper cervical spine: The skull base is within normal limits. Craniocervical junction is normal. Midline sagittal structures are unremarkable.  Sinuses/Orbits: The paranasal sinuses and mastoid air cells are clear. Globes and orbits are within normal  limits.  MRA HEAD FINDINGS  The cervical left ICA is tortuous without focal stenosis. There is mild irregularity through the cavernous segments bilaterally. The A1 and M1 segments are normal. MCA bifurcations are intact. ACA and MCA branch vessels are within normal limits.  Left vertebral artery is the dominant vessel. The right PICA origin is visualized and normal. The left PICA is not visualized. The basilar artery is normal. Both posterior cerebral arteries originate from the basilar tip. 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 More distal segmental narrowing is present on the right.  IMPRESSION: 1. No acute intracranial abnormality. 2. Advanced atrophy and white matter disease. This likely reflects the sequela of chronic microvascular ischemia. 3. 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 4. Tortuosity of the cervical left ICA without significant stenosis. This is nonspecific, but often seen in the setting of hypertension.              The brain MRI and MRA reviewed in person.  No acute lesions are seen on DWI.  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  No   Anterior large vessel occlusions are appreciated although there is some luminal irregularity involving the right M1 segment. There is stenosis of the left P2 segment.   Jacob Fleming A. Merlene Laughter, M.D.  Diplomate, Tax adviser of Psychiatry and Neurology ( Neurology). 11/16/2017, 5:16 PM

## 2017-11-16 NOTE — Evaluation (Signed)
Physical Therapy Evaluation Patient Details Name: Jacob Fleming MRN: [id number] DOB: [birth year] Today's Date: 11/16/2017   History of Present Illness  Jacob Fleming  is a 77 y.o. male, w hypertension, hyperlipidemia, bph, anxiety, ? Dementia, apparently presents with c/o dizziness and generally not feeling well.  Pt is a poor historian.  His daughter was concerned about his dizziness and therefore brought him into ED.     Clinical Impression  Patient functioning near baseline for functional mobility and gait, c/o only of mild nausea after walking in hallways and steps.  PLAN:  Patient discharged from physical therapy to care of nursing for ambulation daily as tolerated for length of stay.    Follow Up Recommendations No PT follow up;Supervision - Intermittent    Equipment Recommendations  None recommended by PT    Recommendations for Other Services       Precautions / Restrictions Precautions Precautions: None Restrictions Weight Bearing Restrictions: No      Mobility  Bed Mobility Overal bed mobility: Independent                Transfers Overall transfer level: Modified independent Equipment used: None                Ambulation/Gait Ambulation/Gait assistance: Independent Ambulation Distance (Feet): 150 Feet Assistive device: None Gait Pattern/deviations: WFL(Within Functional Limits)   Gait velocity interpretation: at or above normal speed for age/gender General Gait Details: normal speed of cadence, no loss of balance  Stairs Stairs: Yes Stairs assistance: Modified independent (Device/Increase time) Stair Management: One rail Right;One rail Left;Alternating pattern Number of Stairs: 9 General stair comments: demonstrates good return for going up/down stairs using 1 side rail without loss of balance  Wheelchair Mobility    Modified Rankin (Stroke Patients Only)       Balance Overall balance assessment: No apparent balance deficits (not  formally assessed)                                           Pertinent Vitals/Pain Pain Assessment: 0-10 Pain Score: 4  Pain Location: frontal headache Pain Descriptors / Indicators: Aching Pain Intervention(s): Limited activity within patient's tolerance;Monitored during session    Home Living Family/patient expects to be discharged to:: Private residence Living Arrangements: Alone Available Help at Discharge: Family Type of Home: House Home Access: Stairs to enter Entrance Stairs-Rails: None Secretary/administrator of Steps: 4 Home Layout: Multi-level Home Equipment: None      Prior Function Level of Independence: Independent         Comments: works out at [employer]        Extremity/Trunk Assessment   Upper Extremity Assessment Upper Extremity Assessment: Overall WFL for tasks assessed    Lower Extremity Assessment Lower Extremity Assessment: Overall WFL for tasks assessed    Cervical / Trunk Assessment Cervical / Trunk Assessment: Normal  Communication   Communication: No difficulties  Cognition Arousal/Alertness: Awake/alert Behavior During Therapy: WFL for tasks assessed/performed Overall Cognitive Status: Within Functional Limits for tasks assessed                                        General Comments      Exercises     Assessment/Plan    PT Assessment Patent does  not need any further PT services  PT Problem List         PT Treatment Interventions      PT Goals (Current goals can be found in the Care Plan section)  Acute Rehab PT Goals Patient Stated Goal: return home PT Goal Formulation: With patient/family Time For Goal Achievement: 11/16/17 Potential to Achieve Goals: Good    Frequency     Barriers to discharge        Co-evaluation               AM-PAC PT "6 Clicks" Daily Activity  Outcome Measure Difficulty turning over in bed (including adjusting bedclothes, sheets  and blankets)?: None Difficulty moving from lying on back to sitting on the side of the bed? : None Difficulty sitting down on and standing up from a chair with arms (e.g., wheelchair, bedside commode, etc,.)?: None Help needed moving to and from a bed to chair (including a wheelchair)?: None Help needed walking in hospital room?: None Help needed climbing 3-5 steps with a railing? : None 6 Click Score: 24    End of Session   Activity Tolerance: Patient tolerated treatment well Patient left: in bed;with family/visitor present;with call bell/phone within reach Nurse Communication: Mobility status PT Visit Diagnosis: Unsteadiness on feet (R26.81);Other abnormalities of gait and mobility (R26.89);Muscle weakness (generalized) (M62.81)    Time: [id number] PT Time Calculation (min) (ACUTE ONLY): 23 min   Charges:   PT Evaluation $PT Eval Low Complexity: 1 Low PT Treatments $Gait Training: 23-37 mins   PT G Codes:        {1:27 PM, 11/16/17 Ocie BobJames Pearly [place]ito, MPT Physical Therapist with [facility] [phone] office [phone] mobile phone

## 2017-11-16 NOTE — Progress Notes (Signed)
ANTICOAGULATION CONSULT NOTE - Initial Consult  Pharmacy Consult for lovenox Indication: VTE prophylaxis  No Known Allergies  Patient Measurements: Height: 5\' 9"  (175.3 cm) Weight: 145 lb (65.8 kg) IBW/kg (Calculated) : 70.7   Vital Signs: Temp: 98.3 F (36.8 C) (01/23 1210) Temp Source: Oral (01/23 0810) BP: 127/66 (01/23 1210) Pulse Rate: 77 (01/23 1210)  Labs: Recent Labs    11/15/17 1932 11/16/17 0901  HGB 13.8 12.6*  HCT 41.0 37.6*  PLT 231 222  APTT 30  --   LABPROT 13.6  --   INR 1.05  --   CREATININE 1.30* 1.20  TROPONINI <0.03  --     Estimated Creatinine Clearance: 47.2 mL/min (by C-G formula based on SCr of 1.2 mg/dL).   Medical History: Past Medical History:  Diagnosis Date  . Anxiety   . BPH (benign prostatic hyperplasia)   . History of kidney stones   . Hyperlipidemia   . Hypertension     Medications:  Medications Prior to Admission  Medication Sig Dispense Refill Last Dose  . aspirin EC 81 MG tablet Take 81 mg by mouth daily.   Past Month at Unknown time  . chlorthalidone (HYGROTON) 25 MG tablet Take 25 mg by mouth daily.    Past Week at Unknown time  . donepezil (ARICEPT) 10 MG tablet take 1 tablet at bedtime   Past Week at Unknown time  . doxepin (SINEQUAN) 10 MG capsule Take 10 mg by mouth at bedtime as needed (sleep).   unknown  . finasteride (PROSCAR) 5 MG tablet Take 5 mg by mouth daily.   Past Week at Unknown time  . fluticasone (FLONASE) 50 MCG/ACT nasal spray Place 2 sprays into the nose daily as needed for allergies.    Past Month at Unknown time  . ibuprofen (ADVIL,MOTRIN) 800 MG tablet Take 800 mg by mouth every 8 (eight) hours as needed for moderate pain.   unknown  . loratadine (CLARITIN) 10 MG tablet Take 10 mg by mouth daily as needed for allergies.    unknown  . memantine (NAMENDA TITRATION PACK) tablet pack Take as directed per package instructions. Titration. Take 5mg  in the morning and 10mg  in the evenings. To increase to  20mg  daily on 11/21/2017  0 Past Week at Unknown time  . potassium citrate (UROCIT-K) 10 MEQ (1080 MG) SR tablet Take 10 mEq by mouth 2 (two) times daily.   Past Week at Unknown time  . Tamsulosin HCl (FLOMAX) 0.4 MG CAPS Take 0.4 mg by mouth daily.     Past Week at Unknown time  . zolpidem (AMBIEN) 10 MG tablet Take 10 mg by mouth at bedtime as needed for sleep.   Past Month at Unknown time    Assessment: 77 yo man to start lovenox for VTE px Goal of Therapy:  Prevention of VTE Monitor platelets by anticoagulation protocol: Yes   Plan:  Lovenox 40 mg sq q24 hours CBC q 3 days  Jacob Fleming 11/16/2017,5:00 PM

## 2017-11-17 ENCOUNTER — Inpatient Hospital Stay (HOSPITAL_COMMUNITY)
Admit: 2017-11-17 | Discharge: 2017-11-17 | Disposition: A | Discharge status: Home / Self Care | Payer: Medicare Other | Attending: Neurology | Admitting: Neurology

## 2017-11-17 LAB — VITAMIN B12: Vitamin B-12: 733 pg/mL (ref 180–914)

## 2017-11-17 MED ORDER — ROSUVASTATIN CALCIUM 10 MG PO TABS: 5.0000 mg | ORAL_TABLET | Freq: Every day | ORAL | Status: DC

## 2017-11-17 MED ORDER — ROSUVASTATIN CALCIUM 5 MG PO TABS: 5.0000 mg | ORAL_TABLET | Freq: Every day | ORAL | 2 refills | Status: AC

## 2017-11-17 NOTE — Care Management Important Message (Signed)
Important Message  Patient Details  Name: Jacob Fleming MRN: [id number] Date of Birth: [birth year]   Medicare Important Message Given:  Yes    Brynda Heick, Chrystine OilerSharley Diane, RN 11/17/2017, 11:56 AM

## 2017-11-17 NOTE — Evaluation (Signed)
Speech Language Pathology Evaluation Patient Details Name: Jacob Fleming MRN: [id number] DOB: 09-03-1941 Today's Date: 11/17/2017 Time: [id number] SLP Time Calculation (min) (ACUTE ONLY): 44 min  Problem List:  Patient Active Problem List   Diagnosis Date Noted  . Dizziness 11/16/2017  . TIA (transient ischemic attack) 11/15/2017  . HYPERLIPIDEMIA 08/01/2009  . HYPERTENSION 08/01/2009   Past Medical History:  Past Medical History:  Diagnosis Date  . Anxiety   . BPH (benign prostatic hyperplasia)   . History of kidney stones   . Hyperlipidemia   . Hypertension    Past Surgical History:  Past Surgical History:  Procedure Laterality Date  . APPENDECTOMY    . BACK SURGERY     L5-L6  . CYSTOSCOPY W/ RETROGRADES     pyelogram  . CYSTOSCOPY/RETROGRADE/URETEROSCOPY/STONE EXTRACTION WITH BASKET Left 05/09/2013   Procedure: CYSTOSCOPY/LEFT RETROGRADE/URETEROSCOPY/STONE EXTRACTION WITH BASKET;  Surgeon: Ky BarbanMohammad I Javaid, MD;  Location: [facility];  Service: Urology;  Laterality: Left;  . HOLMIUM LASER APPLICATION N/A 05/09/2013   Procedure: HOLMIUM LASER APPLICATION;  Surgeon: Ky BarbanMohammad I Javaid, MD;  Location: [facility];  Service: Urology;  Laterality: N/A;  . INGUINAL HERNIA REPAIR Right 03/07/2015   Procedure: HERNIA REPAIR INGUINAL ADULT WITH MESH;  Surgeon: Franky MachoMark Jenkins Md, MD;  Location: [facility];  Service: General;  Laterality: Right;  . LITHOTRIPSY N/A 05/09/2013   Procedure: LITHOTRIPSY;  Surgeon: Ky BarbanMohammad I Javaid, MD;  Location: [facility];  Service: Urology;  Laterality: N/A;  . TONSILLECTOMY     HPI:  77 year old man admitted from home on 1/22 due to headache, dizziness as well as gait imbalance.  The symptoms resolved after arrival to the ED.  MRI is negative for CVA, has been admitted for TIA workup.   Assessment / Plan / Recommendation Clinical Impression  Pt administered MoCA to help identify current cognitive linguistic functioning as well as Pt/family interview. Pt with score of  10/30 on MoCA (normal >25) with significant deficits in memory, orientation, and executive functioning. Pt is alert and conversant, often tangential, and exhibits covering behaviors typical in those with dementia. He stated that he cooks all his meals at home (daughter denies) and drives the [employer] daily. His daughter lives 2 miles away and checks on him daily. She stated that she felt safe bringing him home and felt that he would be less confused in his home environment. Pt demonstrating severe memory deficits at this time and will need 24 hour supervision at home. His driving privileges have not been revoked per daughter, however he is unsafe to drive in his current condition. SLP recommended f/u SLP services via home health SLP to provide education and support at home. Pt appears very resistant to this and daughter expresses that she feels things will be fine "in a couple of days at home".  No further acute SLP services indicated at this time.    SLP Assessment  SLP Recommendation/Assessment: All further Speech Lanaguage Pathology  needs can be addressed in the next venue of care SLP Visit Diagnosis: Cognitive communication deficit (R41.841)    Follow Up Recommendations  Home health SLP    Frequency and Duration (Pt likely discharging today per daughter)         SLP Evaluation Cognition  Overall Cognitive Status: Impaired/Different from baseline Arousal/Alertness: Awake/alert Orientation Level: Oriented to person;Disoriented to place;Disoriented to time;Disoriented to situation Attention: Sustained Sustained Attention: Impaired Sustained Attention Impairment: Verbal basic Memory: Impaired Memory Impairment: Storage deficit;Retrieval deficit;Decreased recall of new information;Decreased short term memory;Prospective memory  Decreased Short Term Memory: Verbal basic Awareness: Impaired Awareness Impairment: Intellectual impairment Problem Solving: Impaired Problem Solving Impairment:  Functional complex;Verbal complex Executive Function: Self Correcting;Organizing;Reasoning Reasoning: Impaired Reasoning Impairment: Verbal basic;Functional complex Organizing: Impaired Self Correcting: Impaired Self Correcting Impairment: Verbal complex;Functional complex Behaviors: (covering behaviors for memory impairment) Safety/Judgment: Impaired Comments: poor insight into deficits; still drives       Comprehension  Auditory Comprehension Overall Auditory Comprehension: Impaired Yes/No Questions: Within Functional Limits Commands: Impaired Multistep Basic Commands: 50-74% accurate Conversation: Complex Interfering Components: Attention;Working memory EffectiveTechniques: Repetition Counsellor: Exceptions to [employer] Reading Comprehension Reading Status: Impaired Functional Environmental (signs, name badge): Impaired Interfering Components: Visual scanning;Working Dance movement psychotherapist: (Pt required scanning cues)    Expression Expression Primary Mode of Expression: Verbal Verbal Expression Overall Verbal Expression: Impaired Initiation: No impairment Automatic Speech: Name;Social Response Level of Generative/Spontaneous Verbalization: Conversation Repetition: Impaired Level of Impairment: Sentence level Naming: Impairment Responsive: 76-100% accurate Confrontation: Impaired Convergent: 50-74% accurate Divergent: 50-74% accurate Pragmatics: No impairment(covering behaviors noted) Effective Techniques: Phonemic cues Non-Verbal Means of Communication: Not applicable Written Expression Dominant Hand: Right Written Expression: Not tested   Oral / Motor  Oral Motor/Sensory Function Overall Oral Motor/Sensory Function: Within functional limits Motor Speech Overall Motor Speech: Appears within functional limits for tasks assessed Respiration: Within functional limits Phonation: Normal Resonance: Within functional  limits Articulation: Within functional limitis Intelligibility: Intelligible Motor Planning: Witnin functional limits Motor Speech Errors: Not applicable   Thank you,  Havery Moros, CCC-SLP [phone]                     PORTER,DABNEY 11/17/2017, 12:10 PM

## 2017-11-17 NOTE — Discharge Summary (Signed)
Physician Discharge Summary  Jacob Fleming [id number]:[id number] DOB: [birth year] DOA: 11/15/2017  PCP: Gareth MorganKnowlton, Steve, MD  Admit date: 11/15/2017 Discharge date: 11/17/2017  Time spent: 45 minutes  Recommendations for Outpatient Follow-up:  -To be discharged home today. -Advise follow-up with primary care provider in 2 weeks. -Home health services will be arranged. -Have requested follow-up with neurology in 2 weeks, at that time results of EEG can be discussed as they are pending at time of discharge.  Discharge Diagnoses:  Principal Problem:   TIA (transient ischemic attack) Active Problems:   Dizziness   Discharge Condition: Stable and improved  Filed Weights   11/15/17 1912  Weight: 65.8 kg (145 lb)    History of present illness:  As per Dr. Selena BattenKim on 1/22:  Jacob Fleming  is a 77 y.o. male, w hypertension, hyperlipidemia, bph, anxiety, ? Dementia, apparently presents with c/o dizziness and generally not feeling well.  Pt is a poor historian.  His daughter was concerned about his dizziness and therefore brought him into ED.   In Ed,  Na 142, K 3.9,  Glucose 117 Bun 16, Creatinine 1.30  Wbc 7.7, Hgb 13.8, Plt 231  INR 1.05   Urine negative Trop I <0.03  CT brain IMPRESSION: 1. No acute finding. 2. Moderate atrophy most notable in the medial temporal lobes. Chronic small vessel ischemia.   Pt will be admitted for dizziness r/o CVA    Hospital Course:   ?TIA -Other than headache, his symptoms have resolved, currently has no focal neurologic deficits. -MRI is negative for CVA. 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 -Have requested neurology consultation for further recommendations in light of above findings.  He believes that this likely represents a reaction to newly started Namenda and not TIA. -2D echo: Ejection fraction of 60-65%, normal wall motion, grade 1 diastolic dysfunction, no patent foramen ovale was identified or other atrial  septal defect. -Carotid Dopplers: Less than 50% stenosis on the left, antegrade bilateral vertebral arterial flow. -Continue aspirin for secondary stroke prevention. -PT seen with recommendations for no further PT follow-up.  Hospital delirium -Should improve once patient returns home.  Dementia -Continue Aricept, Sinequan. -Namenda has been discontinued.   Procedures:  As above  EEG pending at time of discharge  Consultations:  Neurology  Discharge Instructions  Discharge Instructions    Diet - low sodium heart healthy   Complete by:  As directed    Increase activity slowly   Complete by:  As directed      Allergies as of 11/17/2017   No Known Allergies     Medication List    STOP taking these medications   AMBIEN 10 MG tablet Generic drug:  zolpidem   memantine tablet pack Commonly known as:  NAMENDA TITRATION PACK     TAKE these medications   aspirin EC 81 MG tablet Take 81 mg by mouth daily.   chlorthalidone 25 MG tablet Commonly known as:  HYGROTON Take 25 mg by mouth daily.   donepezil 10 MG tablet Commonly known as:  ARICEPT take 1 tablet at bedtime   doxepin 10 MG capsule Commonly known as:  SINEQUAN Take 10 mg by mouth at bedtime as needed (sleep).   finasteride 5 MG tablet Commonly known as:  PROSCAR Take 5 mg by mouth daily.   FLOMAX 0.4 MG Caps capsule Generic drug:  tamsulosin Take 0.4 mg by mouth daily.   fluticasone 50 MCG/ACT nasal spray Commonly known as:  FLONASE Place 2 sprays into the nose daily as needed for allergies.   ibuprofen 800 MG tablet Commonly known as:  ADVIL,MOTRIN Take 800 mg by mouth every 8 (eight) hours as needed for moderate pain.   loratadine 10 MG tablet Commonly known as:  CLARITIN Take 10 mg by mouth daily as needed for allergies.   potassium citrate 10 MEQ (1080 MG) SR tablet Commonly known as:  UROCIT-K Take 10 mEq by mouth 2 (two) times daily.   rosuvastatin 5 MG tablet Commonly known  as:  CRESTOR Take 1 tablet (5 mg total) by mouth daily at 6 PM.      No Known Allergies Follow-up Information    [name], MD. Schedule an appointment as soon as possible for a visit in 2 week(s).   Specialty:  Neurology Contact information: [address] [place] [address] [phone]        Gareth Morgan, MD. Schedule an appointment as soon as possible for a visit in 1 week(s).   Specialty:  Family Medicine Contact information: [address] [place] [address] [phone]        [facility] Follow up.   Contact information: [address] [place] [address] [phone]           The results of significant diagnostics from this hospitalization (including imaging, microbiology, ancillary and laboratory) are listed below for reference.    Significant Diagnostic Studies: Dg Chest 2 View  Result Date: 11/15/2017 CLINICAL DATA:  Initial evaluation for acute dizziness with weakness. History of hypertension. EXAM: CHEST  2 VIEW COMPARISON:  Prior CT from 02/26/2009. FINDINGS: The cardiac and mediastinal silhouettes are stable in size and contour, and remain within normal limits. Aortic atherosclerosis. The lungs are normally inflated. No airspace consolidation, pleural effusion, or pulmonary edema is identified. There is no pneumothorax. No acute osseous abnormality identified. IMPRESSION: No active cardiopulmonary disease. Electronically Signed   By: Rise Mu M.D.   On: 11/15/2017 22:57   Ct Head Wo Contrast  Result Date: 11/15/2017 CLINICAL DATA:  Altered level of consciousness. Headache and weakness. EXAM: CT HEAD WITHOUT CONTRAST TECHNIQUE: Contiguous axial images were obtained from the base of the skull through the vertex without intravenous contrast. COMPARISON:  Brain MRI 09/25/2013 FINDINGS: Brain: No evidence of acute infarction, hemorrhage, hydrocephalus, extra-axial collection or mass lesion/mass  effect. Atrophy most notable in the medial temporal lobes. Chronic small vessel ischemia with confluent low-density in the periventricular white matter. Vascular: Atherosclerotic calcification. Skull: Negative Sinuses/Orbits: Negative IMPRESSION: 1. No acute finding. 2. Moderate atrophy most notable in the medial temporal lobes. Chronic small vessel ischemia. Electronically Signed   By: Marnee Spring M.D.   On: 11/15/2017 19:53   Mr Brain Wo Contrast  Result Date: 11/16/2017 CLINICAL DATA:  Headache and weakness.  Unsteady gait.  TIA. EXAM: MRI HEAD WITHOUT CONTRAST MRA HEAD WITHOUT CONTRAST TECHNIQUE: Multiplanar, multiecho pulse sequences of the brain and surrounding structures were obtained without intravenous contrast. Angiographic images of the head were obtained using MRA technique without contrast. COMPARISON:  CT head without contrast 11/15/2017. MRI brain 09/25/2013. The. FINDINGS: MRI HEAD FINDINGS Brain: Advanced atrophy and white matter disease is present bilaterally. Ventricles are proportionate to the degree of atrophy. White matter changes extend into the brainstem. No acute infarct, hemorrhage, or mass lesion is present. No significant extra-axial fluid collection is present. Vascular: Flow is present in the major intracranial arteries. The globes and orbits are within normal limits. Skull  and upper cervical spine: The skull base is within normal limits. Craniocervical junction is normal. Midline sagittal structures are unremarkable. Sinuses/Orbits: The paranasal sinuses and mastoid air cells are clear. Globes and orbits are within normal limits. MRA HEAD FINDINGS The cervical left ICA is tortuous without focal stenosis. There is mild irregularity through the cavernous segments bilaterally. The A1 and M1 segments are normal. MCA bifurcations are intact. ACA and MCA branch vessels are within normal limits. Left vertebral artery is the dominant vessel. The right PICA origin is visualized and  normal. The left PICA is not visualized. The basilar artery is normal. Both posterior cerebral arteries originate from the basilar tip. 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 More distal segmental narrowing is present on the right. IMPRESSION: 1. No acute intracranial abnormality. 2. Advanced atrophy and white matter disease. This likely reflects the sequela of chronic microvascular ischemia. 3. 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 4. Tortuosity of the cervical left ICA without significant stenosis. This is nonspecific, but often seen in the setting of hypertension. Electronically Signed   By: Marin Roberts M.D.   On: 11/16/2017 08:33   US Carotid Bilateral (at [facility] Only)  Result Date: 11/16/2017 CLINICAL DATA:  Dizzy, possible TIA.  Hypertension, hyperlipidemia. EXAM: BILATERAL CAROTID DUPLEX ULTRASOUND TECHNIQUE: Wallace Cullens scale imaging, color Doppler and duplex ultrasound was performed of bilateral carotid and vertebral arteries in the neck. COMPARISON:  None. TECHNIQUE: Quantification of carotid stenosis is based on velocity parameters that correlate the residual internal carotid diameter with NASCET-based stenosis levels, using the diameter of the distal internal carotid lumen as the denominator for stenosis measurement. The following velocity measurements were obtained: PEAK SYSTOLIC/END DIASTOLIC RIGHT ICA:                     74/14cm/sec CCA:                     101/10cm/sec SYSTOLIC ICA/CCA RATIO:  0.7 DIASTOLIC ICA/CCA RATIO: 1.3 ECA:                     94cm/sec LEFT ICA:                     76/18cm/sec CCA:                     109/15cm/sec SYSTOLIC ICA/CCA RATIO:  0.7 DIASTOLIC ICA/CCA RATIO: 1.2 ECA:                     122cm/sec FINDINGS: RIGHT CAROTID ARTERY: No significant plaque deposition or stenosis. Normal waveforms and color Doppler signal. Distal ICA mildly tortuous.  RIGHT VERTEBRAL ARTERY: Normal flow direction and waveform. A nonspecific cardiac arrhythmia is noted. LEFT CAROTID ARTERY: Intimal thickening through the common carotid artery. Minimal eccentric nonocclusive plaque in the proximal ICA. No high-grade stenosis. Normal waveforms and color Doppler signal. LEFT VERTEBRAL ARTERY: Normal flow direction and waveform. IMPRESSION: 1. Early left proximal ICA plaque resulting in less than 50% diameter stenosis. 2.  Antegrade bilateral vertebral arterial flow. Electronically Signed   By: Corlis Leak M.D.   On: 11/16/2017 09:09   Mr Maxine Glenn Head/brain WU Cm  Result Date: 11/16/2017 CLINICAL DATA:  Headache and weakness.  Unsteady gait.  TIA. EXAM: MRI HEAD WITHOUT CONTRAST MRA HEAD WITHOUT CONTRAST TECHNIQUE: Multiplanar, multiecho  pulse sequences of the brain and surrounding structures were obtained without intravenous contrast. Angiographic images of the head were obtained using MRA technique without contrast. COMPARISON:  CT head without contrast 11/15/2017. MRI brain 09/25/2013. The. FINDINGS: MRI HEAD FINDINGS Brain: Advanced atrophy and white matter disease is present bilaterally. Ventricles are proportionate to the degree of atrophy. White matter changes extend into the brainstem. No acute infarct, hemorrhage, or mass lesion is present. No significant extra-axial fluid collection is present. Vascular: Flow is present in the major intracranial arteries. The globes and orbits are within normal limits. Skull and upper cervical spine: The skull base is within normal limits. Craniocervical junction is normal. Midline sagittal structures are unremarkable. Sinuses/Orbits: The paranasal sinuses and mastoid air cells are clear. Globes and orbits are within normal limits. MRA HEAD FINDINGS The cervical left ICA is tortuous without focal stenosis. There is mild irregularity through the cavernous segments bilaterally. The A1 and M1 segments are normal. MCA bifurcations are intact. ACA  and MCA branch vessels are within normal limits. Left vertebral artery is the dominant vessel. The right PICA origin is visualized and normal. The left PICA is not visualized. The basilar artery is normal. Both posterior cerebral arteries originate from the basilar tip. 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 More distal segmental narrowing is present on the right. IMPRESSION: 1. No acute intracranial abnormality. 2. Advanced atrophy and white matter disease. This likely reflects the sequela of chronic microvascular ischemia. 3. 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 4. Tortuosity of the cervical left ICA without significant stenosis. This is nonspecific, but often seen in the setting of hypertension. Electronically Signed   By: Marin Roberts M.D.   On: 11/16/2017 08:33    Microbiology: No results found for this or any previous visit (from the past 240 hour(s)).   Labs: Basic Metabolic Panel: Recent Labs  Lab 11/15/17 1932 11/16/17 0901  NA 142 135  K 3.9 3.8  CL 101 99*  CO2 29 27  GLUCOSE 117* 135*  BUN 16 15  CREATININE 1.30* 1.20  CALCIUM 9.9 9.3   Liver Function Tests: Recent Labs  Lab 11/16/17 0901  AST 27  ALT 18  ALKPHOS 45  BILITOT 1.1  PROT 6.7  ALBUMIN 4.0   No results for input(s): LIPASE, AMYLASE in the last 168 hours. No results for input(s): AMMONIA in the last 168 hours. CBC: Recent Labs  Lab 11/15/17 1932 11/16/17 0901  WBC 7.7 7.7  NEUTROABS 5.8  --   HGB 13.8 12.6*  HCT 41.0 37.6*  MCV 96.9 96.2  PLT 231 222   Cardiac Enzymes: Recent Labs  Lab 11/15/17 1932  TROPONINI <0.03   BNP: BNP (last 3 results) No results for input(s): BNP in the last 8760 hours.  ProBNP (last 3 results) No results for input(s): PROBNP in the last 8760 hours.  CBG: Recent Labs  Lab 11/15/17 1951  GLUCAP 95       Signed:  Chaya Jan  [facility] Pager: [phone] 11/17/2017, 4:16 PM

## 2017-11-17 NOTE — Progress Notes (Signed)
EEG completed, results pending. 

## 2017-11-17 NOTE — Evaluation (Signed)
Occupational Therapy Evaluation Patient Details Name: Jacob Fleming MRN: [id number] DOB: 1941/10/20 Today's Date: 11/17/2017    History of Present Illness Jacob Fleming  is a 77 y.o. male, w hypertension, hyperlipidemia, bph, anxiety, ? Dementia, apparently presents with c/o dizziness and generally not feeling well.  Pt is a poor historian.  His daughter was concerned about his dizziness and therefore brought him into ED. MRI negative for acute infarct   Clinical Impression   Pt received supine in bed, sitter in room, agreeable to OT evaluation. Pt performing ADL tasks with modified independence, cuing for locating restroom. Pt is confused throughout session, asking why he is at the hospital and who brought him. Pt satisfied with OT's explanation although still seems confused. OT also notes pt wearing boxers overtop of his nightgown. Pt is at baseline with functional task completion, recommend 24/7 supervision for safety due to cognition. No further OT services required at this time.     Follow Up Recommendations  No OT follow up;Supervision/Assistance - 24 hour    Equipment Recommendations  None recommended by OT       Precautions / Restrictions Precautions Precautions: None Restrictions Weight Bearing Restrictions: No      Mobility Bed Mobility Overal bed mobility: Independent                Transfers Overall transfer level: Modified independent Equipment used: None                      ADL either performed or assessed with clinical judgement   ADL Overall ADL's : Modified independent     Grooming: Wash/dry hands;Modified independent               Lower Body Dressing: Modified independent;Sitting/lateral leans   Toilet Transfer: Modified Independent;Ambulation;Regular Toilet   Toileting- Clothing Manipulation and Hygiene: Modified independent;Sit to/from stand       Functional mobility during ADLs: Modified independent       Vision Baseline  Vision/History: No visual deficits Patient Visual Report: No change from baseline Vision Assessment?: No apparent visual deficits            Pertinent Vitals/Pain Pain Assessment: No/denies pain     Hand Dominance Right   Extremity/Trunk Assessment Upper Extremity Assessment Upper Extremity Assessment: Overall WFL for tasks assessed   Lower Extremity Assessment Lower Extremity Assessment: Defer to PT evaluation   Cervical / Trunk Assessment Cervical / Trunk Assessment: Normal   Communication Communication Communication: No difficulties   Cognition Arousal/Alertness: Awake/alert Behavior During Therapy: WFL for tasks assessed/performed Overall Cognitive Status: Within Functional Limits for tasks assessed                                                Home Living Family/patient expects to be discharged to:: Private residence Living Arrangements: Alone Available Help at Discharge: Family Type of Home: House Home Access: Stairs to enter Secretary/administratorntrance Stairs-Number of Steps: 4 Entrance Stairs-Rails: None Home Layout: Multi-level Alternate Level Stairs-Number of Steps: 15 steps to basement Alternate Level Stairs-Rails: Left Bathroom Shower/Tub: Tub/shower unit         Home Equipment: None          Prior Functioning/Environment Level of Independence: Independent        Comments: works out at [employer]        OT Problem List:  Decreased cognition       End of Session    Activity Tolerance: Patient tolerated treatment well Patient left: in bed;with call bell/phone within reach;with bed alarm set;with nursing/sitter in room  OT Visit Diagnosis: Muscle weakness (generalized) (M62.81);Other symptoms and signs involving cognitive function                Time: 1610-9604 OT Time Calculation (min): 16 min Charges:  OT General Charges $OT Visit: 1 Visit OT Evaluation $OT Eval Low Complexity: 1 Low   Ezra Sites, OTR/L   [phone] 11/17/2017, 7:48 AM

## 2017-11-17 NOTE — Procedures (Signed)
  [facility] Kaytee Taliercio A. Gerilyn Pilgrimoonquah, MD     [email or web address]           HISTORY: The patient is a 77 year old male who presents with confusion and altered mental status.  The study is being done to evaluate for seizure as an etiology of the confusion.  MEDICATIONS: Scheduled Meds: Continuous Infusions: PRN Meds:.  Prior to Admission medications   Medication Sig Start Date End Date Taking? Authorizing Provider  aspirin EC 81 MG tablet Take 81 mg by mouth daily.    [provider]  chlorthalidone (HYGROTON) 25 MG tablet Take 25 mg by mouth daily.  06/14/16   [provider]  donepezil (ARICEPT) 10 MG tablet take 1 tablet at bedtime 03/25/17   [provider]  doxepin (SINEQUAN) 10 MG capsule Take 10 mg by mouth at bedtime as needed (sleep).    [provider]  finasteride (PROSCAR) 5 MG tablet Take 5 mg by mouth daily.    [provider]  fluticasone (FLONASE) 50 MCG/ACT nasal spray Place 2 sprays into the nose daily as needed for allergies.     [provider]  ibuprofen (ADVIL,MOTRIN) 800 MG tablet Take 800 mg by mouth every 8 (eight) hours as needed for moderate pain.    [provider]  loratadine (CLARITIN) 10 MG tablet Take 10 mg by mouth daily as needed for allergies.     [provider]  potassium citrate (UROCIT-K) 10 MEQ (1080 MG) SR tablet Take 10 mEq by mouth 2 (two) times daily.    [provider]  rosuvastatin (CRESTOR) 5 MG tablet Take 1 tablet (5 mg total) by mouth daily at 6 PM. 11/17/17   Philip AspenHernandez Acosta, Limmie PatriciaEstela Y, MD  Tamsulosin HCl (FLOMAX) 0.4 MG CAPS Take 0.4 mg by mouth daily.      [provider]      ANALYSIS: A 16 channel recording using standard 10 20 measurements is conducted for 21 minutes.  There is a well-formed posterior dominant rhythm of nine hertz which attenuates with eye opening.  There is beta activity observed in the frontal areas.  Awake and drowsy  activities are observed.  Photic stimulation and hyperventilation were not conducted.  There is no focal or lateralized slowing.  There is no epileptiform activity is observed.    IMPRESSION:   This is a normal recording of awake and drowsy states.      Loris Winrow A. Gerilyn Pilgrimoonquah, M.D.  Diplomate, Biomedical engineerAmerican Board of Psychiatry and Neurology ( Neurology).

## 2017-11-17 NOTE — Progress Notes (Signed)
Pt discharged in stable condition into the care of his daughter via private vehicle.  PIV removed intact w/o S&S of complications.  Discharge instructions reviewed with pt/daughter. Pt/daughter verbalized understanding.

## 2017-11-17 NOTE — Progress Notes (Signed)
Patient was confused during shift and wandering halls. Advised patient to stay in bed, patient thought he was at his home and did not know he was in a hospital and wanted to go home. Called patients daughter and let him speak to her about why he was in the hospital. Patient was still very confused and kept getting out of bed wandering into halls. Ordered for a [employer] tele sitter to watch patient. Patient still got up even with  Tele sitter present. Had NT sit in room with patient and he did good with having someone in there. Advised to have a sitter with patient if possible.

## 2017-11-17 NOTE — Care Management Note (Addendum)
Case Management Note  Patient Details  Name: Fredia Beetshomas B Vanorman MRN: [id number] Date of Birth: [birth year]     Expected Discharge Date:     11/17/2017             Expected Discharge Plan:  Home w Home Health Services  In-House Referral:     Discharge planning Services  CM Consult  Post Acute Care Choice:  Home Health Choice offered to:  Adult Children  DME Arranged:    DME Agency:     HH Arranged:  RN, Social Work [employer]:  [facility]  Status of Service:  Completed, signed off  If discussed at [employer] Meetings, dates discussed:    Additional Comments: Patient discharging home today. He is from home alone, has dementia, ind with ADL's. Daughter lives close by and checks in daily. No recommendations from PT other than supervision. No HH or DME pta. [facility] We discuss L[facility] options for future needs. [facility]. [facility], Mariane DuvalGinny [phone]. Spoke with MD, will add [facility]. Daughter aware.  Adisa Litt, Chrystine OilerSharley Diane, RN 11/17/2017, 11:47 AM

## 2017-11-18 LAB — HOMOCYSTEINE: HOMOCYSTEINE-NORM: 13.7 umol/L (ref 0.0–15.0)

## 2017-11-18 LAB — RPR: RPR Ser Ql: NONREACTIVE

## 2019-12-19 ENCOUNTER — Ambulatory Visit: Payer: Medicare Other | Attending: Internal Medicine

## 2019-12-19 ENCOUNTER — Other Ambulatory Visit: Payer: Self-pay

## 2019-12-19 DIAGNOSIS — Z20822 Contact with and (suspected) exposure to covid-19: Secondary | ICD-10-CM

## 2019-12-20 LAB — NOVEL CORONAVIRUS, NAA: SARS-CoV-2, NAA: NOT DETECTED

## 2020-03-09 ENCOUNTER — Emergency Department (HOSPITAL_COMMUNITY): Payer: Medicare Other

## 2020-03-09 ENCOUNTER — Emergency Department (HOSPITAL_COMMUNITY)
Admission: EM | Admit: 2020-03-09 | Discharge: 2020-03-09 | Disposition: A | Discharge status: Home / Self Care | Payer: Medicare Other | Attending: Emergency Medicine | Admitting: Emergency Medicine

## 2020-03-09 ENCOUNTER — Other Ambulatory Visit: Payer: Self-pay

## 2020-03-09 ENCOUNTER — Encounter (HOSPITAL_COMMUNITY): Payer: Self-pay

## 2020-03-09 DIAGNOSIS — W500XXA Accidental hit or strike by another person, initial encounter: Secondary | ICD-10-CM | POA: Insufficient documentation

## 2020-03-09 DIAGNOSIS — Y9389 Activity, other specified: Secondary | ICD-10-CM | POA: Diagnosis not present

## 2020-03-09 DIAGNOSIS — Y999 Unspecified external cause status: Secondary | ICD-10-CM | POA: Diagnosis not present

## 2020-03-09 DIAGNOSIS — W19XXXA Unspecified fall, initial encounter: Secondary | ICD-10-CM | POA: Insufficient documentation

## 2020-03-09 DIAGNOSIS — S0990XA Unspecified injury of head, initial encounter: Secondary | ICD-10-CM | POA: Diagnosis not present

## 2020-03-09 DIAGNOSIS — Z79899 Other long term (current) drug therapy: Secondary | ICD-10-CM | POA: Diagnosis not present

## 2020-03-09 DIAGNOSIS — Y92128 Other place in nursing home as the place of occurrence of the external cause: Secondary | ICD-10-CM | POA: Diagnosis not present

## 2020-03-09 DIAGNOSIS — F0390 Unspecified dementia without behavioral disturbance: Secondary | ICD-10-CM | POA: Insufficient documentation

## 2020-03-09 DIAGNOSIS — I10 Essential (primary) hypertension: Secondary | ICD-10-CM | POA: Diagnosis not present

## 2020-03-09 DIAGNOSIS — Z7982 Long term (current) use of aspirin: Secondary | ICD-10-CM | POA: Insufficient documentation

## 2020-03-09 HISTORY — DX: Unspecified dementia, unspecified severity, without behavioral disturbance, psychotic disturbance, mood disturbance, and anxiety: F03.90
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
## 2020-03-09 NOTE — Discharge Instructions (Signed)
Your head CT did not show an acute intracranial injury.  Please follow-up with your family doctor in the office.  Please return for change in mental status or persistent vomiting.

## 2020-03-09 NOTE — ED Notes (Signed)
Daughter with pt
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
## 2020-03-28 ENCOUNTER — Emergency Department (HOSPITAL_COMMUNITY)
Admission: EM | Admit: 2020-03-28 | Discharge: 2020-03-28 | Disposition: A | Discharge status: Home / Self Care | Payer: Medicare Other | Attending: Emergency Medicine | Admitting: Emergency Medicine

## 2020-03-28 ENCOUNTER — Other Ambulatory Visit: Payer: Self-pay

## 2020-03-28 DIAGNOSIS — R1110 Vomiting, unspecified: Secondary | ICD-10-CM | POA: Diagnosis present

## 2020-03-28 DIAGNOSIS — N401 Enlarged prostate with lower urinary tract symptoms: Secondary | ICD-10-CM | POA: Diagnosis not present

## 2020-03-28 DIAGNOSIS — I10 Essential (primary) hypertension: Secondary | ICD-10-CM | POA: Diagnosis not present

## 2020-03-28 DIAGNOSIS — N3001 Acute cystitis with hematuria: Secondary | ICD-10-CM | POA: Diagnosis not present

## 2020-03-28 DIAGNOSIS — Z79899 Other long term (current) drug therapy: Secondary | ICD-10-CM | POA: Insufficient documentation

## 2020-03-28 DIAGNOSIS — F0390 Unspecified dementia without behavioral disturbance: Secondary | ICD-10-CM | POA: Diagnosis not present

## 2020-03-28 DIAGNOSIS — Z7982 Long term (current) use of aspirin: Secondary | ICD-10-CM | POA: Diagnosis not present

## 2020-03-28 LAB — COMPREHENSIVE METABOLIC PANEL
ALT: 15 U/L (ref 0–44)
AST: 19 U/L (ref 15–41)
Albumin: 3.9 g/dL (ref 3.5–5.0)
Alkaline Phosphatase: 102 U/L (ref 38–126)
Anion gap: 10 (ref 5–15)
BUN: 33 mg/dL — ABNORMAL HIGH (ref 8–23)
CO2: 29 mmol/L (ref 22–32)
Calcium: 9.5 mg/dL (ref 8.9–10.3)
Chloride: 104 mmol/L (ref 98–111)
Creatinine, Ser: 1.57 mg/dL — ABNORMAL HIGH (ref 0.61–1.24)
GFR calc Af Amer: 48 mL/min — ABNORMAL LOW (ref >60)
GFR calc non Af Amer: 41 mL/min — ABNORMAL LOW (ref >60)
Glucose, Bld: 120 mg/dL — ABNORMAL HIGH (ref 70–99)
Potassium: 4.4 mmol/L (ref 3.5–5.1)
Sodium: 143 mmol/L (ref 135–145)
Total Bilirubin: 0.6 mg/dL (ref 0.3–1.2)
Total Protein: 7.2 g/dL (ref 6.5–8.1)

## 2020-03-28 LAB — CBC WITH DIFFERENTIAL/PLATELET
Abs Immature Granulocytes: 0.05 10*3/uL (ref 0.00–0.07)
Basophils Absolute: 0 10*3/uL (ref 0.0–0.1)
Basophils Relative: 0 %
Eosinophils Absolute: 1.4 10*3/uL — ABNORMAL HIGH (ref 0.0–0.5)
Eosinophils Relative: 8 %
HCT: 42.8 % (ref 39.0–52.0)
Hemoglobin: 14.2 g/dL (ref 13.0–17.0)
Immature Granulocytes: 0 %
Lymphocytes Relative: 7 %
Lymphs Abs: 1.1 10*3/uL (ref 0.7–4.0)
MCH: 32.1 pg (ref 26.0–34.0)
MCHC: 33.2 g/dL (ref 30.0–36.0)
MCV: 96.8 fL (ref 80.0–100.0)
Monocytes Absolute: 1.2 10*3/uL — ABNORMAL HIGH (ref 0.1–1.0)
Monocytes Relative: 7 %
Neutro Abs: 12.7 10*3/uL — ABNORMAL HIGH (ref 1.7–7.7)
Neutrophils Relative %: 78 %
Platelets: 263 10*3/uL (ref 150–400)
RBC: 4.42 MIL/uL (ref 4.22–5.81)
RDW: 13.2 % (ref 11.5–15.5)
WBC: 16.4 10*3/uL — ABNORMAL HIGH (ref 4.0–10.5)
nRBC: 0 % (ref 0.0–0.2)

## 2020-03-28 LAB — URINALYSIS, ROUTINE W REFLEX MICROSCOPIC
Bilirubin Urine: NEGATIVE
Glucose, UA: NEGATIVE mg/dL
Hgb urine dipstick: NEGATIVE
Ketones, ur: NEGATIVE mg/dL
Nitrite: NEGATIVE
Protein, ur: NEGATIVE mg/dL
Specific Gravity, Urine: 1.015 (ref 1.005–1.030)
WBC, UA: >50 WBC/hpf — ABNORMAL HIGH (ref 0–5)
pH: 6 (ref 5.0–8.0)

## 2020-03-28 LAB — LIPASE, BLOOD: Lipase: 38 U/L (ref 11–51)

## 2020-03-28 MED ORDER — SODIUM CHLORIDE 0.9 % IV BOLUS
500.0000 mL | Freq: Once | INTRAVENOUS | Status: AC
  Administered 2020-03-28: 500 mL via INTRAVENOUS

## 2020-03-28 MED ORDER — CEPHALEXIN 500 MG PO CAPS: 500.0000 mg | ORAL_CAPSULE | Freq: Two times a day (BID) | ORAL | 0 refills | Status: AC

## 2020-03-28 MED ORDER — FLUCONAZOLE 100 MG PO TABS
100.0000 mg | ORAL_TABLET | Freq: Once | ORAL | Status: AC
  Administered 2020-03-28: 100 mg via ORAL
  Filled 2020-03-28: qty 1

## 2020-03-28 MED ORDER — FLUCONAZOLE 100 MG PO TABS: 100.0000 mg | ORAL_TABLET | Freq: Every day | ORAL | 0 refills | Status: AC

## 2020-03-28 MED ORDER — CEPHALEXIN 500 MG PO CAPS
500.0000 mg | ORAL_CAPSULE | Freq: Once | ORAL | Status: AC
  Administered 2020-03-28: 500 mg via ORAL
  Filled 2020-03-28: qty 1

## 2020-03-28 NOTE — ED Notes (Signed)
Pt and pts facility verbalize understanding of DC instructions. DC paperwork sent with [facility].

## 2020-03-28 NOTE — ED Provider Notes (Signed)
[facility]-EMERGENCY DEPT Provider Note   CSN: [id number] Arrival date & time: 03/28/20  1206     History Chief Complaint  Patient presents with  . Emesis    Dementia     Jacob Fleming is a 79 y.o. male.  History of dementia, kidney stones, hypertension, hyperlipidemia, BPH.  History obtained through triage note. Patient arrives via EMS following 1 episode of emesis at [facility] facility.  Patient alert and oriented x1 at baseline, no blood thinner use.  Level 5 caveat dementia  On my evaluation patient sleeping comfortably no acute distress, reports that he is feeling well he has no complaints he is requesting a blanket. HPI     Past Medical History:  Diagnosis Date  . Anxiety   . BPH (benign prostatic hyperplasia)   . Dementia (HCC)   . History of kidney stones   . Hyperlipidemia   . Hypertension     Patient Active Problem List   Diagnosis Date Noted  . Dizziness 11/16/2017  . TIA (transient ischemic attack) 11/15/2017  . HYPERLIPIDEMIA 08/01/2009  . HYPERTENSION 08/01/2009    Past Surgical History:  Procedure Laterality Date  . APPENDECTOMY    . BACK SURGERY     L5-L6  . CYSTOSCOPY W/ RETROGRADES     pyelogram  . CYSTOSCOPY/RETROGRADE/URETEROSCOPY/STONE EXTRACTION WITH BASKET Left 05/09/2013   Procedure: CYSTOSCOPY/LEFT RETROGRADE/URETEROSCOPY/STONE EXTRACTION WITH BASKET;  Surgeon: Ky Barban, MD;  Location: [facility];  Service: Urology;  Laterality: Left;  . HOLMIUM LASER APPLICATION N/A 05/09/2013   Procedure: HOLMIUM LASER APPLICATION;  Surgeon: Ky Barban, MD;  Location: [facility];  Service: Urology;  Laterality: N/A;  . INGUINAL HERNIA REPAIR Right 03/07/2015   Procedure: HERNIA REPAIR INGUINAL ADULT WITH MESH;  Surgeon: Franky Macho Md, MD;  Location: [facility];  Service: General;  Laterality: Right;  . LITHOTRIPSY N/A 05/09/2013   Procedure: LITHOTRIPSY;  Surgeon: Ky Barban, MD;  Location: [facility];   Service: Urology;  Laterality: N/A;  . TONSILLECTOMY         Family History  Problem Relation Age of Onset  . Dementia Mother   . Other Father        perforated intestine  . Diabetes Brother   . Diabetes Sister   . Coronary artery disease Other   . Diabetes Other   . Hypertension Other     Social History   Tobacco Use  . Smoking status: Never Smoker  . Smokeless tobacco: Never Used  Substance Use Topics  . Alcohol use: No  . Drug use: No    Home Medications Prior to Admission medications   Medication Sig Start Date End Date Taking? Authorizing Provider  aspirin EC 81 MG tablet Take 81 mg by mouth daily.    [provider]  chlorthalidone (HYGROTON) 25 MG tablet Take 25 mg by mouth daily.  06/14/16   [provider]  donepezil (ARICEPT) 10 MG tablet take 1 tablet at bedtime 03/25/17   [provider]  doxepin (SINEQUAN) 10 MG capsule Take 10 mg by mouth at bedtime as needed (sleep).    [provider]  finasteride (PROSCAR) 5 MG tablet Take 5 mg by mouth daily.    [provider]  fluticasone (FLONASE) 50 MCG/ACT nasal spray Place 2 sprays into the nose daily as needed for allergies.     [provider]  ibuprofen (ADVIL,MOTRIN) 800 MG tablet Take 800 mg by mouth every 8 (eight) hours as needed  for moderate pain.    [provider]  loratadine (CLARITIN) 10 MG tablet Take 10 mg by mouth daily as needed for allergies.     [provider]  potassium citrate (UROCIT-K) 10 MEQ (1080 MG) SR tablet Take 10 mEq by mouth 2 (two) times daily.    [provider]  rosuvastatin (CRESTOR) 5 MG tablet Take 1 tablet (5 mg total) by mouth daily at 6 PM. 11/17/17   Isaac Bliss, Rayford Halsted, MD  Tamsulosin HCl (FLOMAX) 0.4 MG CAPS Take 0.4 mg by mouth daily.      [provider]    Allergies    Patient has no known allergies.  Review of Systems   Review of Systems  Unable to perform ROS: Dementia     Physical Exam Updated Vital Signs BP 103/86   Pulse 100   Temp 97.6 F (36.4 C)   Resp 17   SpO2 100%   Physical Exam Constitutional:      General: He is not in acute distress.    Appearance: Normal appearance. He is well-developed. He is not ill-appearing or diaphoretic.  HENT:     Head: Normocephalic and atraumatic.  Eyes:     General: Vision grossly intact. Gaze aligned appropriately.     Pupils: Pupils are equal, round, and reactive to light.  Neck:     Trachea: Trachea and phonation normal.  Pulmonary:     Effort: Pulmonary effort is normal. No respiratory distress.  Chest:     Comments: No evidence of injury Abdominal:     General: There is no distension.     Palpations: Abdomen is soft.     Tenderness: There is no abdominal tenderness. There is no guarding or rebound.     Comments: No evidence of injury  Musculoskeletal:        General: Normal range of motion.     Cervical back: Normal range of motion. No spinous process tenderness or muscular tenderness.     Comments: No midline C/T/L spinal tenderness to palpation, no paraspinal muscle tenderness, no deformity, crepitus, or step-off noted. No sign of injury to the neck or back.  Skin:    General: Skin is warm and dry.  Neurological:     Mental Status: He is alert.     GCS: GCS eye subscore is 4. GCS verbal subscore is 5. GCS motor subscore is 6.     Comments: Speech is clear and goal oriented, follows commands Major Cranial nerves without deficit, no facial droop Moves extremities without ataxia, coordination intact  Psychiatric:        Behavior: Behavior normal.     ED Results / Procedures / Treatments   Labs (all labs ordered are listed, but only abnormal results are displayed) Labs Reviewed  CBC WITH DIFFERENTIAL/PLATELET - Abnormal; Notable for the following components:      Result Value   WBC 16.4 (*)    Neutro Abs 12.7 (*)    Monocytes Absolute 1.2 (*)    Eosinophils Absolute 1.4 (*)    All  other components within normal limits  COMPREHENSIVE METABOLIC PANEL - Abnormal; Notable for the following components:   Glucose, Bld 120 (*)    BUN 33 (*)    Creatinine, Ser 1.57 (*)    GFR calc non Af Amer 41 (*)    GFR calc Af Amer 48 (*)    All other components within normal limits  URINALYSIS, ROUTINE W REFLEX MICROSCOPIC - Abnormal; Notable for the  following components:   APPearance CLOUDY (*)    Leukocytes,Ua LARGE (*)    WBC, UA >50 (*)    Bacteria, UA FEW (*)    All other components within normal limits  URINE CULTURE  LIPASE, BLOOD    EKG None  Radiology No results found.  Procedures Procedures (including critical care time)  Medications Ordered in ED Medications  sodium chloride 0.9 % bolus 500 mL (0 mLs Intravenous Stopped 03/28/20 1516)  cephALEXin (KEFLEX) capsule 500 mg (500 mg Oral Given 03/28/20 1514)  fluconazole (DIFLUCAN) tablet 100 mg (100 mg Oral Given 03/28/20 1514)    ED Course  I have reviewed the triage vital signs and the nursing notes.  Pertinent labs & imaging results that were available during my care of the patient were reviewed by me and considered in my medical decision making (see chart for details).  Clinical Course as of Mar 29 1535  Fri Mar 28, 2020  1301 [phone] No answer   [BM]  1316 [phone] Mariane Duval   [BM]  1319 [facility] [phone]: No answer   [BM]  26 79 yo male w/ dementia, oriented only to self, presenting from [facility] with single episode of dark emesis at nursing facility.  Benign physical exam, no abdominal tenderness.  Fairly significant dementia, pt cannot provide additional history.  Family member Boneta Lucks was unaware pt was in ED.  Pt is not on A/C.  Pending labs here.   [MT]  1351 [phone]: no answer   [BM]  1440 Will tx for uti and possible fungal infection in urine.  Anticipate dishcarge   [MT]  1440 [phone]: Ginny   [BM]  1443 [phone]: Glee Arvin RN   [BM]  1443 35 mL/min creatinine  clearance; no adjustment for Keflex (500mg  BID x 5 days)  Reduced dose Fluconazole 100 mg once daily x2 weeks.   [BM]    Clinical Course User Index [BM] , PA-C [MT] Bill Salinas Renaye Rakers, MD   MDM Rules/Calculators/A&P                     Additional History Obtained: 1. Nursing notes from this visit. 2. [facility] nurse Latoya. 3. Patient's daughter Kermit Balo. ---------- 79 year old male with dementia arrived from nursing home after 1 episode of dark emesis at facility.  No nausea, vomiting with EMS prior to arrival.  On evaluation he is well-appearing no acute distress denies any pain reports he is feeling well.  He is alert and oriented x1 which is apparently his baseline.  I attempted to call the facility multiple times however there is no answer.  I had spoken with his daughter 96 1:16 PM, she was unaware the patient was sent to the ER and is also tried to contact the facility.  She advises that patient is currently only alert to self, no blood thinner use.  Basic labs including CBC, CMP, lipase and urinalysis were obtained.  I ordered, reviewed and interpreted labs which include: Urinalysis which shows UTI and fungal infection CBC which shows mild leukocytosis 64 CMP which shows creatinine of 1.57 and BUN of 33, which is slightly elevated from prior 2 years ago when creatinine was 1.2, however CMP is prior to that show similar creatinines around 1.4. Lipase within normal limits. - I spoke again with patient's daughter Mariane Duval at 2:40 PM, she got in contact with nursing facility and gave me Latoya's phone number.  I spoke with patient's RN Latoya who advised the patient acting normally  this morning and eating breakfast when he had 1 episode of sudden emesis, he vomited up his food, she denies any blood feels the emesis was dark red, he was eating pancakes and ham.  Patient acting normally after no history of fever or pain. - Suspect patient symptoms today may be secondary  to urinary tract infection.  He has no pain or other symptoms to suggest kidney stone.  Additionally vital signs are stable and he has had no recurrence of vomiting in the ER, plan of care is to start patient on Keflex and fluconazole for treatment of bacteria and fungus in urine.  Creatinine clearance of 35 mL/min, will start Keflex 500 mg twice daily x 5 days and fluconazole 100 mg daily x2 weeks. Urine sent for culture.  I spoke with both the patient's daughter and Theme [employer] manager and we discussed work-up, diagnosis and treatment they state agreement and understanding.  Return precautions discussed and included in AVS. All questions answered.  At this time there does not appear to be any evidence of an acute emergency medical condition and the patient appears stable for discharge with appropriate outpatient follow up.   Patient seen and evaluated by Dr. Renaye Rakers during this visit who agrees with discharge at this time with Fluconazole 100mg  daily x 14 days and Keflex 500mg  BID x 5 days and PCP follow-up.  Note: Portions of this report may have been transcribed using voice recognition software. Every effort was made to ensure accuracy; however, inadvertent computerized transcription errors may still be present.  Final Clinical Impression(s) / ED Diagnoses Final diagnoses:  Acute cystitis with hematuria    Rx / DC Orders ED Discharge Orders         Ordered    cephALEXin (KEFLEX) 500 MG capsule  2 times daily     03/28/20 1515    fluconazole (DIFLUCAN) 100 MG tablet  Daily     03/28/20 1515           05/28/20 03/28/20 1536    Elizabeth Palau, MD 03/29/20 [phone]

## 2020-03-28 NOTE — ED Notes (Signed)
Spoke to pts daughter to update on condition and care plan

## 2020-03-28 NOTE — Discharge Instructions (Addendum)
At this time there does not appear to be the presence of an emergent medical condition, however there is always the potential for conditions to change. Please read and follow the below instructions.  Please return to the Emergency Department immediately for any new or worsening symptoms. Please be sure to follow up with your Primary Care Provider within one week regarding your visit today; please call their office to schedule an appointment even if you are feeling better for a follow-up visit. Please take your antibiotic Keflex as prescribed, 500 mg twice daily for the next 5 days for treatment of urinary tract infection.  Please take the antifungal medication fluconazole as prescribed 100 mg once daily for the next 2 weeks.  Drink enough water to avoid dehydration.  Get help right away if: You have very bad back pain. You have very bad pain in your lower belly. You have a fever. You are sick to your stomach (nauseous). You are throwing up. You have any new/concerning or worsening of symptoms   Please read the additional information packets attached to your discharge summary.  Do not take your medicine if  develop an itchy rash, swelling in your mouth or lips, or difficulty breathing; call 911 and seek immediate emergency medical attention if this occurs.  Note: Portions of this text may have been transcribed using voice recognition software. Every effort was made to ensure accuracy; however, inadvertent computerized transcription errors may still be present.

## 2020-03-28 NOTE — ED Triage Notes (Signed)
Pt arrived via [facility] from [facility] dark tinged Emesis X1 per staff. Per EMS no emesis in route or at scene. Per EMS ambulatory and A/OX1 baseline and not on blood thinners.   POA paperwork at bedside   Hx dementia and alzheimer, HTN

## 2020-03-28 NOTE — ED Notes (Addendum)
Pt states " you better go tell the doctor I am getting the f**k out of here"

## 2020-03-28 NOTE — ED Notes (Addendum)
[facility] called for transport home. Family called to update on condition and plan of care. DC and [facility] Paperwork at bedside.   Care handoff given to [facility] coordinator at [facility]

## 2020-03-28 NOTE — ED Notes (Signed)
[facility] at bedside 

## 2020-03-29 LAB — URINE CULTURE: Culture: 20000 — AB

## 2020-03-30 ENCOUNTER — Telehealth: Payer: Self-pay | Admitting: Emergency Medicine

## 2020-03-30 NOTE — Telephone Encounter (Signed)
Post ED Visit - Positive Culture Follow-up  Culture report reviewed by antimicrobial stewardship pharmacist: [facility] []  , Pharm.D. []  Enzo Bi, .D., BCPS AQ-ID []  Celedonio Miyamoto, Pharm.D., BCPS []  [address], Pharm.D., BCPS []  [place], Garvin Fila.D., BCPS, AAHIVP []  , Pharm.D., BCPS, AAHIVP []  Georgina Pillion, PharmD, BCPS []  , PharmD, BCPS []  [place], PharmD, BCPS []  [address], PharmD []  , PharmD, BCPS []  Estella Husk, PharmD  Pharmacy Team []  Lysle Pearl, PharmD []  , PharmD []  Phillips Climes, PharmD [x]  , Rph []  Agapito Games) , PharmD []  Verlan Friends, PharmD []  , PharmD []  Mervyn Gay, PharmD []  , PharmD []  Vinnie Level, PharmD []  [facility], PharmD []  , PharmD []  Len Childs, PharmD   Positive urine culture No further patient follow-up is required at this time.  Shevette Bess 03/30/2020, 12:45 PM

## 2020-04-04 ENCOUNTER — Emergency Department (HOSPITAL_COMMUNITY)
Admission: EM | Admit: 2020-04-04 | Discharge: 2020-04-04 | Disposition: A | Discharge status: Home / Self Care | Payer: Medicare Other | Attending: Emergency Medicine | Admitting: Emergency Medicine

## 2020-04-04 ENCOUNTER — Emergency Department (HOSPITAL_COMMUNITY): Payer: Medicare Other

## 2020-04-04 ENCOUNTER — Encounter (HOSPITAL_COMMUNITY): Payer: Self-pay | Admitting: Emergency Medicine

## 2020-04-04 DIAGNOSIS — R1110 Vomiting, unspecified: Secondary | ICD-10-CM

## 2020-04-04 DIAGNOSIS — Z79899 Other long term (current) drug therapy: Secondary | ICD-10-CM | POA: Insufficient documentation

## 2020-04-04 DIAGNOSIS — R112 Nausea with vomiting, unspecified: Secondary | ICD-10-CM | POA: Insufficient documentation

## 2020-04-04 DIAGNOSIS — I10 Essential (primary) hypertension: Secondary | ICD-10-CM | POA: Diagnosis not present

## 2020-04-04 DIAGNOSIS — Z87442 Personal history of urinary calculi: Secondary | ICD-10-CM | POA: Diagnosis not present

## 2020-04-04 DIAGNOSIS — R1084 Generalized abdominal pain: Secondary | ICD-10-CM | POA: Insufficient documentation

## 2020-04-04 LAB — URINALYSIS, ROUTINE W REFLEX MICROSCOPIC
Bilirubin Urine: NEGATIVE
Glucose, UA: NEGATIVE mg/dL
Hgb urine dipstick: NEGATIVE
Ketones, ur: 5 mg/dL — AB
Nitrite: NEGATIVE
Protein, ur: 30 mg/dL — AB
Specific Gravity, Urine: 1.025 (ref 1.005–1.030)
WBC, UA: >50 WBC/hpf — ABNORMAL HIGH (ref 0–5)
pH: 5 (ref 5.0–8.0)

## 2020-04-04 LAB — COMPREHENSIVE METABOLIC PANEL
ALT: 14 U/L (ref 0–44)
AST: 19 U/L (ref 15–41)
Albumin: 3.8 g/dL (ref 3.5–5.0)
Alkaline Phosphatase: 104 U/L (ref 38–126)
Anion gap: 10 (ref 5–15)
BUN: 42 mg/dL — ABNORMAL HIGH (ref 8–23)
CO2: 29 mmol/L (ref 22–32)
Calcium: 9 mg/dL (ref 8.9–10.3)
Chloride: 104 mmol/L (ref 98–111)
Creatinine, Ser: 1.67 mg/dL — ABNORMAL HIGH (ref 0.61–1.24)
GFR calc Af Amer: 44 mL/min — ABNORMAL LOW (ref >60)
GFR calc non Af Amer: 38 mL/min — ABNORMAL LOW (ref >60)
Glucose, Bld: 105 mg/dL — ABNORMAL HIGH (ref 70–99)
Potassium: 3.5 mmol/L (ref 3.5–5.1)
Sodium: 143 mmol/L (ref 135–145)
Total Bilirubin: 0.6 mg/dL (ref 0.3–1.2)
Total Protein: 7.4 g/dL (ref 6.5–8.1)

## 2020-04-04 LAB — CBC
HCT: 45.3 % (ref 39.0–52.0)
Hemoglobin: 15.1 g/dL (ref 13.0–17.0)
MCH: 32.2 pg (ref 26.0–34.0)
MCHC: 33.3 g/dL (ref 30.0–36.0)
MCV: 96.6 fL (ref 80.0–100.0)
Platelets: 269 10*3/uL (ref 150–400)
RBC: 4.69 MIL/uL (ref 4.22–5.81)
RDW: 13.4 % (ref 11.5–15.5)
WBC: 14.6 10*3/uL — ABNORMAL HIGH (ref 4.0–10.5)
nRBC: 0 % (ref 0.0–0.2)

## 2020-04-04 LAB — LIPASE, BLOOD: Lipase: 107 U/L — ABNORMAL HIGH (ref 11–51)

## 2020-04-04 MED ORDER — SODIUM CHLORIDE 0.9 % IV BOLUS
1000.0000 mL | Freq: Once | INTRAVENOUS | Status: AC
  Administered 2020-04-04: 15:00:00 1000 mL via INTRAVENOUS

## 2020-04-04 MED ORDER — SODIUM CHLORIDE (PF) 0.9 % IJ SOLN
INTRAMUSCULAR | Status: AC
  Filled 2020-04-04: qty 50

## 2020-04-04 MED ORDER — IOHEXOL 300 MG/ML  SOLN
75.0000 mL | Freq: Once | INTRAMUSCULAR | Status: AC | PRN
  Administered 2020-04-04: 15:00:00 75 mL via INTRAVENOUS

## 2020-04-04 NOTE — ED Provider Notes (Signed)
[facility]-EMERGENCY DEPT Provider Note   CSN: [id number] Arrival date & time: 04/04/20  1303     History Chief Complaint  Patient presents with   Emesis    Jacob Fleming is a 79 y.o. male history of dementia, kidney stones, hypertension, hyperlipidemia, BPH.  History obtained through triage note.  Patient arrives from nursing home for concern of vomiting.  He had not eaten in a couple of days, few hours after eating he had 1 episode of vomiting.  Currently being treated for UTI.  Level 5 caveat dementia.  On initial evaluation patient reports he is feeling well he has no complaints denies any pain.  Does not remember vomiting.  HPI     Past Medical History:  Diagnosis Date   Anxiety    BPH (benign prostatic hyperplasia)    Dementia (HCC)    History of kidney stones    Hyperlipidemia    Hypertension     Patient Active Problem List   Diagnosis Date Noted   Dizziness 11/16/2017   TIA (transient ischemic attack) 11/15/2017   HYPERLIPIDEMIA 08/01/2009   HYPERTENSION 08/01/2009    Past Surgical History:  Procedure Laterality Date   APPENDECTOMY     BACK SURGERY     L5-L6   CYSTOSCOPY W/ RETROGRADES     pyelogram   CYSTOSCOPY/RETROGRADE/URETEROSCOPY/STONE EXTRACTION WITH BASKET Left 05/09/2013   Procedure: CYSTOSCOPY/LEFT RETROGRADE/URETEROSCOPY/STONE EXTRACTION WITH BASKET;  Surgeon: Ky Barban, MD;  Location: [facility];  Service: Urology;  Laterality: Left;   HOLMIUM LASER APPLICATION N/A 05/09/2013   Procedure: HOLMIUM LASER APPLICATION;  Surgeon: Ky Barban, MD;  Location: [facility];  Service: Urology;  Laterality: N/A;   INGUINAL HERNIA REPAIR Right 03/07/2015   Procedure: HERNIA REPAIR INGUINAL ADULT WITH MESH;  Surgeon: Franky Macho Md, MD;  Location: [facility];  Service: General;  Laterality: Right;   LITHOTRIPSY N/A 05/09/2013   Procedure: LITHOTRIPSY;  Surgeon: Ky Barban, MD;  Location: [facility];  Service:  Urology;  Laterality: N/A;   TONSILLECTOMY         Family History  Problem Relation Age of Onset   Dementia Mother    Other Father        perforated intestine   Diabetes Brother    Diabetes Sister    Coronary artery disease Other    Diabetes Other    Hypertension Other     Social History   Tobacco Use   Smoking status: Never Smoker   Smokeless tobacco: Never Used  Vaping Use   Vaping Use: Never used  Substance Use Topics   Alcohol use: No   Drug use: No    Home Medications Prior to Admission medications   Medication Sig Start Date End Date Taking? Authorizing Provider  ALPRAZolam Prudy Feeler) 0.5 MG tablet Take 0.5 mg by mouth daily as needed. 01/03/20   [provider]  aspirin EC 81 MG tablet Take 81 mg by mouth daily.    [provider]  chlorthalidone (HYGROTON) 25 MG tablet Take 25 mg by mouth daily.  06/14/16   [provider]  citalopram (CELEXA) 10 MG tablet Take 10 mg by mouth daily. 02/18/20   [provider]  donepezil (ARICEPT) 10 MG tablet take 1 tablet at bedtime 03/25/17   [provider]  doxepin (SINEQUAN) 10 MG capsule Take 10 mg by mouth at bedtime as needed (sleep).    [provider]  finasteride (PROSCAR) 5 MG tablet Take 5 mg by mouth  daily.    [provider]  fluconazole (DIFLUCAN) 100 MG tablet Take 1 tablet (100 mg total) by mouth daily for 14 days. 03/28/20 04/11/20  Nuala Alpha A, PA-C  fluticasone (FLONASE) 50 MCG/ACT nasal spray Place 2 sprays into the nose daily as needed for allergies.     [provider]  ibuprofen (ADVIL,MOTRIN) 800 MG tablet Take 800 mg by mouth every 8 (eight) hours as needed for moderate pain.    [provider]  loratadine (CLARITIN) 10 MG tablet Take 10 mg by mouth daily as needed for allergies.     [provider]  LORazepam (ATIVAN) 0.5 MG tablet Take 0.5 mg by mouth 2 (two) times daily as needed. 03/28/20   [provider]  potassium citrate (UROCIT-K) 10 MEQ (1080 MG) SR tablet Take 10 mEq by mouth 2 (two) times daily.    [provider]  PREVIDENT 5000 BOOSTER PLUS 1.1 % PSTE APPLY W TOOTHBRUSH AT BEDTIME 10/11/19   [provider]  QUEtiapine Fumarate (SEROQUEL XR) 150 MG 24 hr tablet Take 150 mg by mouth at bedtime. 02/18/20   [provider]  rosuvastatin (CRESTOR) 5 MG tablet Take 1 tablet (5 mg total) by mouth daily at 6 PM. 11/17/17   Isaac Bliss, Rayford Halsted, MD  Tamsulosin HCl (FLOMAX) 0.4 MG CAPS Take 0.4 mg by mouth daily.      [provider]  traZODone (DESYREL) 50 MG tablet Take 50 mg by mouth at bedtime. 02/18/20   [provider]    Allergies    Patient has no known allergies.  Review of Systems   Review of Systems  Unable to perform ROS: Dementia    Physical Exam Updated Vital Signs BP 110/85    Pulse 80    Temp 98.1 F (36.7 C) (Oral)    Resp 18    SpO2 98%   Physical Exam Constitutional:      General: He is not in acute distress.    Appearance: Normal appearance. He is well-developed. He is not ill-appearing or diaphoretic.  HENT:     Head: Normocephalic and atraumatic.  Eyes:     General: Vision grossly intact. Gaze aligned appropriately.     Pupils: Pupils are equal, round, and reactive to light.  Neck:     Trachea: Trachea and phonation normal.  Pulmonary:     Effort: Pulmonary effort is normal. No respiratory distress.  Abdominal:     General: There is no distension.     Palpations: Abdomen is soft.     Tenderness: There is no abdominal tenderness. There is no guarding or rebound.  Musculoskeletal:        General: Normal range of motion.     Cervical back: Normal range of motion.  Skin:    General: Skin is warm and dry.  Neurological:     Mental Status: He is alert.     GCS: GCS eye subscore is 4. GCS verbal subscore is 5. GCS motor subscore is 6.     Comments: Speech is clear and goal oriented, follows  commands Major Cranial nerves without deficit, no facial droop Moves extremities without ataxia, coordination intact  Psychiatric:        Behavior: Behavior normal.     ED Results / Procedures / Treatments   Labs (all labs ordered are listed, but only abnormal results are displayed) Labs Reviewed  LIPASE, BLOOD - Abnormal; Notable for the following components:      Result  Value   Lipase 107 (*)    All other components within normal limits  COMPREHENSIVE METABOLIC PANEL - Abnormal; Notable for the following components:   Glucose, Bld 105 (*)    BUN 42 (*)    Creatinine, Ser 1.67 (*)    GFR calc non Af Amer 38 (*)    GFR calc Af Amer 44 (*)    All other components within normal limits  CBC - Abnormal; Notable for the following components:   WBC 14.6 (*)    All other components within normal limits  URINALYSIS, ROUTINE W REFLEX MICROSCOPIC - Abnormal; Notable for the following components:   APPearance HAZY (*)    Ketones, ur 5 (*)    Protein, ur 30 (*)    Leukocytes,Ua LARGE (*)    WBC, UA >50 (*)    Bacteria, UA RARE (*)    All other components within normal limits  URINE CULTURE    EKG None  Radiology CT ABDOMEN PELVIS W CONTRAST  Result Date: 04/04/2020 CLINICAL DATA:  Suspected pancreatitis EXAM: CT ABDOMEN AND PELVIS WITH CONTRAST TECHNIQUE: Multidetector CT imaging of the abdomen and pelvis was performed using the standard protocol following bolus administration of intravenous contrast. CONTRAST:  13mL OMNIPAQUE IOHEXOL 300 MG/ML  SOLN COMPARISON:  08/06/2014 FINDINGS: Lower chest: Basilar atelectasis. No consolidation. No pleural effusion. No pericardial fluid. Calcified coronary artery disease. Hepatobiliary: No focal hepatic lesion. No pericholecystic stranding. Portal vein is patent. Pancreas: Pancreas without signs of ductal dilation, peripancreatic stranding or lesion. Assessment limited by motion. Spleen: Spleen normal size and contour. Adrenals/Urinary Tract:  Adrenal glands are normal. Signs of horseshoe kidney. Mild dilation of the RIGHT renal moiety, renal pelvis with enlarging calculus in the RIGHT lower pole moiety measuring 2.2 x 1.4 cm. Largest calculus in this area on the prior exam from 2015 was approximately 1 cm Bifid collecting system on the LEFT. Marked cortical scarring bilaterally worse on the LEFT. No visible ureteral calculi. No perinephric fluid. Tiny upper pole calculus on the LEFT. 2 mm. Stomach/Bowel: Small hiatal hernia. Question mild distal esophageal and gastric thickening though respiratory motion limits assessment. No acute gastrointestinal process, mild Lea dilated fluid-filled proximal small bowel loops. Cecum extends into the pelvis. Similar position as compared to the previous exam. Vascular/Lymphatic: Calcific atheromatous plaque of the abdominal aorta without sign of aneurysm. No adenopathy in the retroperitoneum or in the upper abdomen. No pelvic lymphadenopathy. Reproductive: Prostate with calcifications mild heterogeneity, nonspecific on CT. Urinary bladder is unremarkable. Other: No abdominal wall hernia.  No free air.  No ascites. Musculoskeletal: No acute musculoskeletal process. Spinal degenerative changes. IMPRESSION: 1. Enlarging calculus in the RIGHT lower pole moiety of the horseshoe kidney, no signs of obstruction, fullness of the malrotated renal pelvis. 2. 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 3. Marked cortical loss in the region of the horseshoe kidney isthmus since the previous imaging study. 4. Tiny 2 mm upper pole calculus on the LEFT. 5. Question mild distal esophageal and gastric thickening though respiratory motion limits assessment. Correlate with any symptoms of esophagitis or gastritis. 6. Calcified coronary artery disease. 7. Aortic atherosclerosis. Aortic Atherosclerosis (ICD10-I70.0). Electronically Signed   By: Donzetta Kohut M.D.   On: 04/04/2020 16:07     Procedures Procedures (including critical care time)  Medications Ordered in ED Medications  sodium chloride (PF) 0.9 % injection (has no administration in time range)  sodium chloride 0.9 % bolus 1,000 mL (1,000 mLs Intravenous New  Bag/Given (Non-Interop) 04/04/20 1430)  iohexol (OMNIPAQUE) 300 MG/ML solution 75 mL (75 mLs Intravenous Contrast Given 04/04/20 1510)    ED Course  I have reviewed the triage vital signs and the nursing notes.  Pertinent labs & imaging results that were available during my care of the patient were reviewed by me and considered in my medical decision making (see chart for details).  Clinical Course as of Apr 04 1814  Fri Apr 04, 2020  1331 [phone] No answer    [BM]  [address],[place] [phone]: No answer   [BM]  201411 79 yo male w/ dementia presenting to Ed with episode of vomiting at memory care unit today.  He was seen about 1 week ago by myself in the Ed with a similar single episode of vomiting, was diagnosed with UTI and started on antibiotics and anti-fungal (yeast in urine as well).  Patient is pleasantly demented on exam, has some mild nonfocal abodminal tenderness.  Labs noteable for elevation of lipase to 107 from normal last week.  Cr near baseline at 1.67, WBC downtrending from last week.  Plan for CT abdomen with contrast to evaluate for pancreatitis vs biliary disease   [MT]    Clinical Course User Index [BM] Bill SalinasMorelli, Karim Aiello A, PA-C [MT] Renaye Rakersrifan, Kermit BaloMatthew J, MD   MDM Rules/Calculators/A&P                          Additional History Obtained: 1. Nursing notes from this visit. 2. Prior ED visit on March 28, 2020.  Patient diagnosed with acute cystitis with hematuria.  UA with yeast and bacteria.  Mild leukocytosis.  Mild elevation of creatinine.  Patient was discharged on Keflex and fluconazole.  Urine culture last week grew out yeast. 3. Patient's daughter Mariane DuvalGinny at bedside. - I ordered, reviewed and interpreted labs which  include: CBC shows leukocytosis of 14.6, appears improved from last week when it was 16.4.  No evidence of anemia. CMP shows no emergent electrolyte derangement, no elevation of LFTs, no gap.  Creatinine of 1.67 appears similar to last week.  Will give fluid bolus for suspected dehydration due to decreased p.o. intake. Lipase mildly elevated at 107. Urinalysis shows leukocytes, WBCs, bacteria.  Patient had recent urine culture which grew out yeast.  Patient is on fluconazole suspect this may be secondary to yeast UTI.  Patient's family encouraged to continue fluconazole and follow-up with PCP.  Repeat urine culture sent.  CT AP:  IMPRESSION:  1. Enlarging calculus in the RIGHT lower pole moiety of the  horseshoe kidney, no signs of obstruction, fullness of the  malrotated renal pelvis.  2. 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  3. Marked cortical loss in the region of the horseshoe kidney  isthmus since the previous imaging study.  4. Tiny 2 mm upper pole calculus on the LEFT.  5. Question mild distal esophageal and gastric thickening though  respiratory motion limits assessment. Correlate with any symptoms of  esophagitis or gastritis.  6. Calcified coronary artery disease.  7. Aortic atherosclerosis.    Aortic Atherosclerosis (ICD10-I70.0).  - I discussed findings as above with patient's daughter Mariane DuvalGinny.  She states understanding and has no questions.  Patient was also seen and evaluated by Dr. Renaye Rakersrifan.  Reassuring work-up plan of care is to p.o. challenge patient.  If he tolerates well can discharge back to facility with PCP follow-up. - Patient reassessed resting comfortably no acute  distress reports he is feeling well he has no complaints, he would like a blanket and to go home.  Vital signs are stable.  Patient tolerated p.o. without difficulty per RN, it appears stable for discharge.  Incidental findings noted on discharge paperwork for  facility.  At this time there does not appear to be any evidence of an acute emergency medical condition and the patient appears stable for discharge with appropriate outpatient follow up. Diagnosis was discussed with Ginny who verbalizes understanding of care plan and is agreeable to discharge. I have discussed return precautions with Ginny who verbalizes understanding. Ginny encouraged to follow-up with their PCP. All questions answered.  Patient's case and findings discussed with Dr. Renaye Rakers who agrees with plan to discharge to facility with PCP follow-up and continued fluconazole.  Note: Portions of this report may have been transcribed using voice recognition software. Every effort was made to ensure accuracy; however, inadvertent computerized transcription errors may still be present. Final Clinical Impression(s) / ED Diagnoses Final diagnoses:  Non-intractable vomiting, presence of nausea not specified, unspecified vomiting type    Rx / DC Orders ED Discharge Orders    None       Elizabeth Palau 04/04/20 1815    Terald Sleeper, MD 04/05/20 1026

## 2020-04-04 NOTE — ED Notes (Signed)
PTAR bedside 

## 2020-04-04 NOTE — ED Triage Notes (Signed)
Per [facility], currently being treated for UTI-states he ate for the first time in a couple of days-states a few hours later he started vomiting-sent for evaluation

## 2020-04-04 NOTE — ED Notes (Signed)
Pt tolerated PO fluids and food as ordered.

## 2020-04-04 NOTE — Discharge Instructions (Addendum)
At this time there does not appear to be the presence of an emergent medical condition, however there is always the potential for conditions to change. Please read and follow the below instructions.  Please return to the Emergency Department immediately for any new or worsening symptoms. Please be sure to follow up with your Primary Care Provider within one week regarding your visit today; please call their office to schedule an appointment even if you are feeling better for a follow-up visit. Patient was worked up for 1 episode of nausea and vomiting that occurred today.  His lipase is slightly elevated at 107 but does not appear to be pancreatitis.  He will need to have this rechecked by his primary care doctor later on this week.  Additionally his CT scan showed possible thickening of the distal esophagus and stomach, please discuss this with the primary care doctor at follow-up visit for further evaluation.  The CT scan also showed kidney stones within the kidneys along with chronic kidney changes.  Patient's CT scanner read has been copied and pasted below, please discuss the findings in their entirety with patient's primary care doctor at the follow-up visit this week.  Please be sure patient finishes his antifungal medication fluconazole for treatment of urinary tract infection.  Get help right away if: You have pain in your chest, neck, arm, or jaw. You feel very weak or you pass out (faint). You throw up again and again. You have throw up that is bright red or looks like black coffee grounds. You have bloody or black poop (stools) or poop that looks like tar. You have a very bad headache, a stiff neck, or both. You have very bad pain, cramping, or bloating in your belly (abdomen). You have trouble breathing. You are breathing very quickly. Your heart is beating very quickly. Your skin feels cold and clammy. You feel confused. You have signs of losing too much water in your body, such  as: Dark pee, very little pee, or no pee. Cracked lips. Dry mouth. Sunken eyes. Sleepiness. Weakness. You have fever or chills You have any new/concerning or worsening of symptoms  Please read the additional information packets attached to your discharge summary.  Do not take your medicine if  develop an itchy rash, swelling in your mouth or lips, or difficulty breathing; call 911 and seek immediate emergency medical attention if this occurs.  CT Abdomen/Pelvis: CT ABDOMEN PELVIS W CONTRAST  Result Date: 04/04/2020 CLINICAL DATA:  Suspected pancreatitis EXAM: CT ABDOMEN AND PELVIS WITH CONTRAST TECHNIQUE: Multidetector CT imaging of the abdomen and pelvis was performed using the standard protocol following bolus administration of intravenous contrast. CONTRAST:  35mL OMNIPAQUE IOHEXOL 300 MG/ML  SOLN COMPARISON:  08/06/2014 FINDINGS: Lower chest: Basilar atelectasis. No consolidation. No pleural effusion. No pericardial fluid. Calcified coronary artery disease. Hepatobiliary: No focal hepatic lesion. No pericholecystic stranding. Portal vein is patent. Pancreas: Pancreas without signs of ductal dilation, peripancreatic stranding or lesion. Assessment limited by motion. Spleen: Spleen normal size and contour. Adrenals/Urinary Tract: Adrenal glands are normal. Signs of horseshoe kidney. Mild dilation of the RIGHT renal moiety, renal pelvis with enlarging calculus in the RIGHT lower pole moiety measuring 2.2 x 1.4 cm. Largest calculus in this area on the prior exam from 2015 was approximately 1 cm Bifid collecting system on the LEFT. Marked cortical scarring bilaterally worse on the LEFT. No visible ureteral calculi. No perinephric fluid. Tiny upper pole calculus on the LEFT. 2 mm. Stomach/Bowel: Small hiatal hernia. Question  mild distal esophageal and gastric thickening though respiratory motion limits assessment. No acute gastrointestinal process, mild Lea dilated fluid-filled proximal small bowel  loops. Cecum extends into the pelvis. Similar position as compared to the previous exam. Vascular/Lymphatic: Calcific atheromatous plaque of the abdominal aorta without sign of aneurysm. No adenopathy in the retroperitoneum or in the upper abdomen. No pelvic lymphadenopathy. Reproductive: Prostate with calcifications mild heterogeneity, nonspecific on CT. Urinary bladder is unremarkable. Other: No abdominal wall hernia.  No free air.  No ascites. Musculoskeletal: No acute musculoskeletal process. Spinal degenerative changes. IMPRESSION: 1. Enlarging calculus in the RIGHT lower pole moiety of the horseshoe kidney, no signs of obstruction, fullness of the malrotated renal pelvis. 2. 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 3. Marked cortical loss in the region of the horseshoe kidney isthmus since the previous imaging study. 4. Tiny 2 mm upper pole calculus on the LEFT. 5. Question mild distal esophageal and gastric thickening though respiratory motion limits assessment. Correlate with any symptoms of esophagitis or gastritis. 6. Calcified coronary artery disease. 7. Aortic atherosclerosis. Aortic Atherosclerosis (ICD10-I70.0).  Note: Portions of this text may have been transcribed using voice recognition software. Every effort was made to ensure accuracy; however, inadvertent computerized transcription errors may still be present.

## 2020-04-04 NOTE — ED Notes (Signed)
RN walked into room and pt was standing bedside urinating on the floor. Pt handed cup and urinated into cup for UA. Pt now back in bed with bed alarm.

## 2020-04-04 NOTE — ED Notes (Signed)
Jacob Fleming, [name] aware pt voided and UA sent, in and out cath not needed at this time.

## 2020-04-06 LAB — URINE CULTURE: Culture: <10000 — AB

## 2020-04-16 ENCOUNTER — Emergency Department (HOSPITAL_COMMUNITY): Payer: Medicare Other

## 2020-04-16 ENCOUNTER — Emergency Department (HOSPITAL_COMMUNITY)
Admission: EM | Admit: 2020-04-16 | Discharge: 2020-04-17 | Disposition: A | Discharge status: Skilled Nursing Facility | Payer: Medicare Other | Attending: Emergency Medicine | Admitting: Emergency Medicine

## 2020-04-16 ENCOUNTER — Encounter (HOSPITAL_COMMUNITY): Payer: Self-pay

## 2020-04-16 ENCOUNTER — Other Ambulatory Visit: Payer: Self-pay

## 2020-04-16 DIAGNOSIS — I10 Essential (primary) hypertension: Secondary | ICD-10-CM | POA: Diagnosis not present

## 2020-04-16 DIAGNOSIS — N3000 Acute cystitis without hematuria: Secondary | ICD-10-CM | POA: Insufficient documentation

## 2020-04-16 DIAGNOSIS — Z7982 Long term (current) use of aspirin: Secondary | ICD-10-CM | POA: Diagnosis not present

## 2020-04-16 DIAGNOSIS — Z79899 Other long term (current) drug therapy: Secondary | ICD-10-CM | POA: Insufficient documentation

## 2020-04-16 DIAGNOSIS — R531 Weakness: Secondary | ICD-10-CM | POA: Diagnosis present

## 2020-04-16 NOTE — ED Triage Notes (Signed)
Pt comes from [facility], staff says that he's had multiple falls in the past few days and not as alert  He also has frequent urination per staff

## 2020-04-17 DIAGNOSIS — N3000 Acute cystitis without hematuria: Secondary | ICD-10-CM | POA: Diagnosis not present

## 2020-04-17 LAB — CBC WITH DIFFERENTIAL/PLATELET
Abs Immature Granulocytes: 0.06 10*3/uL (ref 0.00–0.07)
Basophils Absolute: 0 10*3/uL (ref 0.0–0.1)
Basophils Relative: 0 %
Eosinophils Absolute: 0.3 10*3/uL (ref 0.0–0.5)
Eosinophils Relative: 2 %
HCT: 37.9 % — ABNORMAL LOW (ref 39.0–52.0)
Hemoglobin: 12.5 g/dL — ABNORMAL LOW (ref 13.0–17.0)
Immature Granulocytes: 1 %
Lymphocytes Relative: 11 %
Lymphs Abs: 1.4 10*3/uL (ref 0.7–4.0)
MCH: 31.9 pg (ref 26.0–34.0)
MCHC: 33 g/dL (ref 30.0–36.0)
MCV: 96.7 fL (ref 80.0–100.0)
Monocytes Absolute: 1 10*3/uL (ref 0.1–1.0)
Monocytes Relative: 8 %
Neutro Abs: 9.3 10*3/uL — ABNORMAL HIGH (ref 1.7–7.7)
Neutrophils Relative %: 78 %
Platelets: 218 10*3/uL (ref 150–400)
RBC: 3.92 MIL/uL — ABNORMAL LOW (ref 4.22–5.81)
RDW: 13.2 % (ref 11.5–15.5)
WBC: 12 10*3/uL — ABNORMAL HIGH (ref 4.0–10.5)
nRBC: 0 % (ref 0.0–0.2)

## 2020-04-17 LAB — COMPREHENSIVE METABOLIC PANEL
ALT: 14 U/L (ref 0–44)
AST: 21 U/L (ref 15–41)
Albumin: 3.6 g/dL (ref 3.5–5.0)
Alkaline Phosphatase: 87 U/L (ref 38–126)
Anion gap: 11 (ref 5–15)
BUN: 28 mg/dL — ABNORMAL HIGH (ref 8–23)
CO2: 25 mmol/L (ref 22–32)
Calcium: 9.1 mg/dL (ref 8.9–10.3)
Chloride: 100 mmol/L (ref 98–111)
Creatinine, Ser: 1.69 mg/dL — ABNORMAL HIGH (ref 0.61–1.24)
GFR calc Af Amer: 43 mL/min — ABNORMAL LOW (ref >60)
GFR calc non Af Amer: 38 mL/min — ABNORMAL LOW (ref >60)
Glucose, Bld: 115 mg/dL — ABNORMAL HIGH (ref 70–99)
Potassium: 3.1 mmol/L — ABNORMAL LOW (ref 3.5–5.1)
Sodium: 136 mmol/L (ref 135–145)
Total Bilirubin: 1.1 mg/dL (ref 0.3–1.2)
Total Protein: 7 g/dL (ref 6.5–8.1)

## 2020-04-17 LAB — URINALYSIS, ROUTINE W REFLEX MICROSCOPIC
Bilirubin Urine: NEGATIVE
Glucose, UA: NEGATIVE mg/dL
Ketones, ur: 5 mg/dL — AB
Nitrite: POSITIVE — AB
Protein, ur: 30 mg/dL — AB
Specific Gravity, Urine: 1.014 (ref 1.005–1.030)
pH: 6 (ref 5.0–8.0)

## 2020-04-17 MED ORDER — CEPHALEXIN 500 MG PO CAPS
500.0000 mg | ORAL_CAPSULE | Freq: Four times a day (QID) | ORAL | 0 refills | Status: DC
Start: 2020-04-17 — End: 2020-05-19

## 2020-04-17 MED ORDER — STERILE WATER FOR INJECTION IJ SOLN
INTRAMUSCULAR | Status: AC
  Administered 2020-04-17: 2.1 mL
  Filled 2020-04-17: qty 10

## 2020-04-17 MED ORDER — CEFTRIAXONE SODIUM 1 G IJ SOLR
1.0000 g | Freq: Once | INTRAMUSCULAR | Status: AC
  Administered 2020-04-17: 1 g via INTRAMUSCULAR
  Filled 2020-04-17: qty 10

## 2020-04-17 NOTE — ED Notes (Signed)
Attempted to call reports to [facility].  Call went to VM and mailbox was full.

## 2020-04-17 NOTE — ED Provider Notes (Signed)
[facility] DEPT Provider Note   CSN: [id number] Arrival date & time: 04/16/20  2321     History No chief complaint on file.   Jacob Fleming is a 79 y.o. male.  The history is provided by the EMS personnel and the nursing home. The history is limited by the condition of the patient.  Weakness Severity:  Mild Onset quality:  Gradual Duration:  2 days Timing:  Constant      Past Medical History:  Diagnosis Date  . Anxiety   . BPH (benign prostatic hyperplasia)   . Dementia ([place])   . History of kidney stones   . Hyperlipidemia   . Hypertension     Patient Active Problem List   Diagnosis Date Noted  . Dizziness 11/16/2017  . TIA (transient ischemic attack) 11/15/2017  . HYPERLIPIDEMIA 08/01/2009  . HYPERTENSION 08/01/2009    Past Surgical History:  Procedure Laterality Date  . APPENDECTOMY    . BACK SURGERY     L5-L6  . CYSTOSCOPY W/ RETROGRADES     pyelogram  . CYSTOSCOPY/RETROGRADE/URETEROSCOPY/STONE EXTRACTION WITH BASKET Left 05/09/2013   Procedure: CYSTOSCOPY/LEFT RETROGRADE/URETEROSCOPY/STONE EXTRACTION WITH BASKET;  Surgeon: Marissa Nestle, MD;  Location: [facility];  Service: Urology;  Laterality: Left;  . HOLMIUM LASER APPLICATION N/A 04/21/3150   Procedure: HOLMIUM LASER APPLICATION;  Surgeon: Marissa Nestle, MD;  Location: [facility];  Service: Urology;  Laterality: N/A;  . INGUINAL HERNIA REPAIR Right 03/07/2015   Procedure: HERNIA REPAIR INGUINAL ADULT WITH MESH;  Surgeon: Aviva Signs Md, MD;  Location: [facility];  Service: General;  Laterality: Right;  . LITHOTRIPSY N/A 05/09/2013   Procedure: LITHOTRIPSY;  Surgeon: Marissa Nestle, MD;  Location: [facility];  Service: Urology;  Laterality: N/A;  . TONSILLECTOMY         Family History  Problem Relation Age of Onset  . Dementia Mother   . Other Father        perforated intestine  . Diabetes Brother   . Diabetes Sister   . Coronary artery disease Other   . Diabetes  Other   . Hypertension Other     Social History   Tobacco Use  . Smoking status: Never Smoker  . Smokeless tobacco: Never Used  Vaping Use  . Vaping Use: Never used  Substance Use Topics  . Alcohol use: No  . Drug use: No    Home Medications Prior to Admission medications   Medication Sig Start Date End Date Taking? Authorizing Provider  aspirin EC 81 MG tablet Take 81 mg by mouth daily.   Yes [provider]  chlorthalidone (HYGROTON) 25 MG tablet Take 25 mg by mouth daily.  06/14/16  Yes [provider]  citalopram (CELEXA) 10 MG tablet Take 10 mg by mouth daily. 02/18/20  Yes [provider]  finasteride (PROSCAR) 5 MG tablet Take 5 mg by mouth daily.   Yes [provider]  LORazepam (ATIVAN) 0.5 MG tablet Take 0.5 mg by mouth daily.  03/28/20  Yes [provider]  Phenazopyridine HCl 97.5 MG TABS Take 2 tablets by mouth 3 (three) times daily after meals.   Yes [provider]  QUEtiapine (SEROQUEL XR) 200 MG 24 hr tablet Take 200 mg by mouth at bedtime.   Yes [provider]  rosuvastatin (CRESTOR) 5 MG tablet Take 1 tablet (5 mg total) by mouth daily at 6 PM. 11/17/17  Yes Isaac Bliss, Rayford Halsted, MD  sertraline (ZOLOFT) 25 MG tablet  Take 25 mg by mouth daily.   Yes [provider]  Tamsulosin HCl (FLOMAX) 0.4 MG CAPS Take 0.4 mg by mouth daily.     Yes [provider]  traZODone (DESYREL) 100 MG tablet Take 100 mg by mouth at bedtime.   Yes [provider]  cephALEXin (KEFLEX) 500 MG capsule Take 1 capsule (500 mg total) by mouth 4 (four) times daily. 04/17/20   Cortasia Screws, Barbara Cower, MD    Allergies    Memantine  Review of Systems   Review of Systems  Unable to perform ROS: Dementia  Neurological: Positive for weakness.    Physical Exam Updated Vital Signs BP 112/61   Pulse 70   Temp (!) 97.5 F (36.4 C) (Oral)   Resp 16   SpO2 97%   Physical Exam Vitals and nursing note reviewed.   Constitutional:      Appearance: He is well-developed.  HENT:     Head: Normocephalic and atraumatic.     Mouth/Throat:     Pharynx: No oropharyngeal exudate or posterior oropharyngeal erythema.  Cardiovascular:     Rate and Rhythm: Normal rate.  Pulmonary:     Effort: Pulmonary effort is normal. No respiratory distress.  Abdominal:     General: There is no distension.  Musculoskeletal:        General: Normal range of motion.     Cervical back: Normal range of motion.  Skin:    General: Skin is warm and dry.  Neurological:     General: No focal deficit present.     Mental Status: He is alert. Mental status is at baseline. He is disoriented.     Cranial Nerves: No cranial nerve deficit.     Sensory: No sensory deficit.     Motor: No weakness.  Psychiatric:        Mood and Affect: Mood normal.     ED Results / Procedures / Treatments   Labs (all labs ordered are listed, but only abnormal results are displayed) Labs Reviewed  CBC WITH DIFFERENTIAL/PLATELET - Abnormal; Notable for the following components:      Result Value   WBC 12.0 (*)    RBC 3.92 (*)    Hemoglobin 12.5 (*)    HCT 37.9 (*)    Neutro Abs 9.3 (*)    All other components within normal limits  COMPREHENSIVE METABOLIC PANEL - Abnormal; Notable for the following components:   Potassium 3.1 (*)    Glucose, Bld 115 (*)    BUN 28 (*)    Creatinine, Ser 1.69 (*)    GFR calc non Af Amer 38 (*)    GFR calc Af Amer 43 (*)    All other components within normal limits  URINALYSIS, ROUTINE W REFLEX MICROSCOPIC - Abnormal; Notable for the following components:   Color, Urine AMBER (*)    Hgb urine dipstick SMALL (*)    Ketones, ur 5 (*)    Protein, ur 30 (*)    Nitrite POSITIVE (*)    Leukocytes,Ua SMALL (*)    Bacteria, UA RARE (*)    All other components within normal limits    EKG EKG Interpretation  Date/Time:  Thursday April 17 2020 00:37:18 EDT Ventricular Rate:  78 PR Interval:    QRS  Duration: 109 QT Interval:  392 QTC Calculation: 447 R Axis:   -55 Text Interpretation: Sinus rhythm Atrial premature complex Inferior infarct, old Probable anterior infarct, age indeterminate given baseline wander and artifact, no obvious changes  Confirmed by Marily Memos [id number]) on 04/17/2020 12:53:12 AM   Radiology CT Head Wo Contrast  Result Date: 04/17/2020 CLINICAL DATA:  Multiple recent falls EXAM: CT HEAD WITHOUT CONTRAST TECHNIQUE: Contiguous axial images were obtained from the base of the skull through the vertex without intravenous contrast. COMPARISON:  None. FINDINGS: Brain: There is no mass, hemorrhage or extra-axial collection. There is generalized atrophy without lobar predilection. Hypodensity of the white matter is most commonly associated with chronic microvascular disease. Vascular: Atherosclerotic calcification of the internal carotid arteries at the skull base. No abnormal hyperdensity of the major intracranial arteries or dural venous sinuses. Skull: The visualized skull base, calvarium and extracranial soft tissues are normal. Sinuses/Orbits: No fluid levels or advanced mucosal thickening of the visualized paranasal sinuses. No mastoid or middle ear effusion. The orbits are normal. IMPRESSION: Generalized atrophy and chronic microvascular ischemia without acute intracranial abnormality. Electronically Signed   By: Deatra Robinson M.D.   On: 04/17/2020 00:14   DG Chest Portable 1 View  Result Date: 04/17/2020 CLINICAL DATA:  Encephalopathy EXAM: PORTABLE CHEST 1 VIEW COMPARISON:  None. FINDINGS: The heart size and mediastinal contours are within normal limits. Both lungs are clear. The visualized skeletal structures are unremarkable. IMPRESSION: No active disease. Electronically Signed   By: Deatra Robinson M.D.   On: 04/17/2020 00:14    Procedures Procedures (including critical care time)  Medications Ordered in ED Medications  cefTRIAXone (ROCEPHIN) injection 1 g (1 g  Intramuscular Given 04/17/20 0301)  sterile water (preservative free) injection (2.1 mLs  Given 04/17/20 0301)    ED Course  I have reviewed the triage vital signs and the nursing notes.  Pertinent labs & imaging results that were available during my care of the patient were reviewed by me and considered in my medical decision making (see chart for details).    MDM Rules/Calculators/A&P                          eval for generalized weakness and fall.   Has UTI, no indication for admission, will dc.   Final Clinical Impression(s) / ED Diagnoses Final diagnoses:  Acute cystitis without hematuria    Rx / DC Orders ED Discharge Orders         Ordered    cephALEXin (KEFLEX) 500 MG capsule  4 times daily     Discontinue  Reprint     04/17/20 0248           Jebidiah Baggerly, Barbara Cower, MD 04/17/20 8469

## 2020-04-17 NOTE — ED Notes (Addendum)
Attempted to call report again w/o an answer.

## 2020-04-17 NOTE — ED Notes (Signed)
[facility] [employer] [facility] notified of need for transport of pt back to residence.  

## 2020-04-28 ENCOUNTER — Emergency Department (HOSPITAL_COMMUNITY): Payer: Medicare Other

## 2020-04-28 ENCOUNTER — Other Ambulatory Visit: Payer: Self-pay

## 2020-04-28 ENCOUNTER — Emergency Department (HOSPITAL_COMMUNITY)
Admission: EM | Admit: 2020-04-28 | Discharge: 2020-04-29 | Disposition: A | Discharge status: Skilled Nursing Facility | Payer: Medicare Other | Attending: Emergency Medicine | Admitting: Emergency Medicine

## 2020-04-28 DIAGNOSIS — Z79899 Other long term (current) drug therapy: Secondary | ICD-10-CM | POA: Insufficient documentation

## 2020-04-28 DIAGNOSIS — F0390 Unspecified dementia without behavioral disturbance: Secondary | ICD-10-CM | POA: Insufficient documentation

## 2020-04-28 DIAGNOSIS — Z8673 Personal history of transient ischemic attack (TIA), and cerebral infarction without residual deficits: Secondary | ICD-10-CM | POA: Insufficient documentation

## 2020-04-28 DIAGNOSIS — I10 Essential (primary) hypertension: Secondary | ICD-10-CM | POA: Insufficient documentation

## 2020-04-28 DIAGNOSIS — E876 Hypokalemia: Secondary | ICD-10-CM

## 2020-04-28 DIAGNOSIS — Z7982 Long term (current) use of aspirin: Secondary | ICD-10-CM | POA: Diagnosis not present

## 2020-04-28 DIAGNOSIS — E860 Dehydration: Secondary | ICD-10-CM | POA: Insufficient documentation

## 2020-04-28 DIAGNOSIS — M6281 Muscle weakness (generalized): Secondary | ICD-10-CM | POA: Diagnosis present

## 2020-04-28 MED ORDER — SODIUM CHLORIDE 0.9 % IV BOLUS
1000.0000 mL | Freq: Once | INTRAVENOUS | Status: AC
  Administered 2020-04-28: 1000 mL via INTRAVENOUS

## 2020-04-28 MED ORDER — LORAZEPAM 2 MG/ML IJ SOLN
1.0000 mg | Freq: Once | INTRAMUSCULAR | Status: AC
  Administered 2020-04-28: 1 mg via INTRAVENOUS
  Filled 2020-04-28: qty 1

## 2020-04-28 NOTE — ED Provider Notes (Signed)
[facility]-EMERGENCY DEPT Provider Note   CSN: [id number] Arrival date & time: 04/28/20  2218     History Chief Complaint  Patient presents with  . Extremity Weakness    Jacob Fleming is a 79 y.o. man with history of dementia, nephrolithiasis, HTN, HLD, and BPH who was BIB EMS from his residence at [facility], a memory care facility, for weakness.  History obtained from triage note, nursing report from EMS, and chart review as patient cannot provide additional history secondary to signficant dementia. Patient arrives from nursing home for concern of episode of weakness. EMS reported being called because the patient was seen having difficulty standing from a seated position. Unclear whether patient had a syncopal episode.   On evaluation, patient is sitting upright in bed in no acute distress, reports initially that he feels "like he got hit by a truck" but is unable to elaborate. Denies pain, N/V, dizziness, CP, SOB, weakness.   This is the patient's fifth ED visit in the last two months. On 6/4, he was seen after a single episode of vomiting, diagnosed with UTI and started on antibiotics and anti-fungal. On 6/11, he was also seen after episode of vomiting and found to have elevated lipase but CT abdomen with contrast negative, continued on abx and antifungal. Most recently, on 6/23, he was seen after an episode of weakness and fall, treated for UTI.    Past Medical History:  Diagnosis Date  . Anxiety   . BPH (benign prostatic hyperplasia)   . Dementia ([facility])   . History of kidney stones   . Hyperlipidemia   . Hypertension     Patient Active Problem List   Diagnosis Date Noted  . Dizziness 11/16/2017  . TIA (transient ischemic attack) 11/15/2017  . HYPERLIPIDEMIA 08/01/2009  . HYPERTENSION 08/01/2009    Past Surgical History:  Procedure Laterality Date  . APPENDECTOMY    . BACK SURGERY     L5-L6  . CYSTOSCOPY W/ RETROGRADES     pyelogram  .  CYSTOSCOPY/RETROGRADE/URETEROSCOPY/STONE EXTRACTION WITH BASKET Left 05/09/2013   Procedure: CYSTOSCOPY/LEFT RETROGRADE/URETEROSCOPY/STONE EXTRACTION WITH BASKET;  Surgeon: Ky Barban, MD;  Location: [facility];  Service: Urology;  Laterality: Left;  . HOLMIUM LASER APPLICATION N/A 05/09/2013   Procedure: HOLMIUM LASER APPLICATION;  Surgeon: Ky Barban, MD;  Location: [facility];  Service: Urology;  Laterality: N/A;  . INGUINAL HERNIA REPAIR Right 03/07/2015   Procedure: HERNIA REPAIR INGUINAL ADULT WITH MESH;  Surgeon: Franky Macho Md, MD;  Location: [facility];  Service: General;  Laterality: Right;  . LITHOTRIPSY N/A 05/09/2013   Procedure: LITHOTRIPSY;  Surgeon: Ky Barban, MD;  Location: [facility];  Service: Urology;  Laterality: N/A;  . TONSILLECTOMY         Family History  Problem Relation Age of Onset  . Dementia Mother   . Other Father        perforated intestine  . Diabetes Brother   . Diabetes Sister   . Coronary artery disease Other   . Diabetes Other   . Hypertension Other     Social History   Tobacco Use  . Smoking status: Never Smoker  . Smokeless tobacco: Never Used  Vaping Use  . Vaping Use: Never used  Substance Use Topics  . Alcohol use: No  . Drug use: No    Home Medications Prior to Admission medications   Medication Sig Start Date End Date Taking? Authorizing Provider  aspirin EC 81 MG tablet  Take 81 mg by mouth daily.   Yes [provider]  chlorthalidone (HYGROTON) 25 MG tablet Take 25 mg by mouth daily.  06/14/16  Yes [provider]  finasteride (PROSCAR) 5 MG tablet Take 5 mg by mouth daily.   Yes [provider]  LORazepam (ATIVAN) 0.5 MG tablet Take 0.5 mg by mouth daily.  03/28/20  Yes [provider]  Phenazopyridine HCl 97.5 MG TABS Take 2 tablets by mouth 3 (three) times daily after meals.   Yes [provider]  QUEtiapine (SEROQUEL XR) 200 MG 24 hr tablet Take 200 mg by mouth at bedtime.   Yes  [provider]  rosuvastatin (CRESTOR) 5 MG tablet Take 1 tablet (5 mg total) by mouth daily at 6 PM. 11/17/17  Yes Philip AspenHernandez Acosta, Limmie PatriciaEstela Y, MD  sertraline (ZOLOFT) 25 MG tablet Take 25 mg by mouth daily.   Yes [provider]  Tamsulosin HCl (FLOMAX) 0.4 MG CAPS Take 0.4 mg by mouth daily.     Yes [provider]  traZODone (DESYREL) 100 MG tablet Take 100 mg by mouth at bedtime.   Yes [provider]  cephALEXin (KEFLEX) 500 MG capsule Take 1 capsule (500 mg total) by mouth 4 (four) times daily. 04/17/20   Mesner, Barbara CowerJason, MD  citalopram (CELEXA) 10 MG tablet Take 10 mg by mouth daily. 02/18/20   [provider]  potassium chloride (KLOR-CON) 20 MEQ packet Take 20 mEq by mouth daily for 5 days. 04/29/20 05/04/20  Alphonzo SeveranceWatson, Sante Biedermann, MD    Allergies    Memantine  Review of Systems   Review of Systems  All other systems reviewed and are negative.   Physical Exam Updated Vital Signs BP 126/60   Pulse 100   Temp (!) 97.5 F (36.4 C) (Oral)   Resp (!) 28   SpO2 99%   Physical Exam Constitutional:      General: He is not in acute distress.    Appearance: He is not ill-appearing.     Comments: Thin, pleasant man in no acute distress.  HENT:     Head: Normocephalic and atraumatic.     Mouth/Throat:     Mouth: Mucous membranes are dry.  Eyes:     Extraocular Movements: Extraocular movements intact.     Pupils: Pupils are equal, round, and reactive to light.  Cardiovascular:     Rate and Rhythm: Normal rate and regular rhythm.     Pulses: Normal pulses.     Heart sounds: Normal heart sounds.  Pulmonary:     Effort: Pulmonary effort is normal.     Breath sounds: Normal breath sounds.  Abdominal:     General: Abdomen is flat.     Palpations: Abdomen is soft.     Tenderness: There is no abdominal tenderness. There is no guarding.  Lymphadenopathy:     Cervical: No cervical adenopathy.  Skin:    General: Skin is warm and dry.    Neurological:     Mental Status: He is alert.     Cranial Nerves: Cranial nerves are intact. No dysarthria or facial asymmetry.     Sensory: Sensation is intact.     Comments: Oriented to self only. States he lives in [place] with his daughter. Follows commands during exam. Thought process tangential.     ED Results / Procedures / Treatments   Labs (all labs ordered are listed, but only abnormal results are displayed) Labs Reviewed  CBC WITH DIFFERENTIAL/PLATELET - Abnormal; Notable for  the following components:      Result Value   RBC 3.76 (*)    Hemoglobin 11.9 (*)    HCT 35.9 (*)    All other components within normal limits  COMPREHENSIVE METABOLIC PANEL - Abnormal; Notable for the following components:   Potassium 3.1 (*)    Glucose, Bld 113 (*)    BUN 27 (*)    Creatinine, Ser 1.59 (*)    GFR calc non Af Amer 40 (*)    GFR calc Af Amer 47 (*)    All other components within normal limits  URINALYSIS, ROUTINE W REFLEX MICROSCOPIC - Abnormal; Notable for the following components:   Hgb urine dipstick MODERATE (*)    Protein, ur 100 (*)    Leukocytes,Ua LARGE (*)    RBC / HPF >50 (*)    Bacteria, UA RARE (*)    All other components within normal limits  I-STAT CHEM 8, ED - Abnormal; Notable for the following components:   Potassium 3.0 (*)    BUN 26 (*)    Creatinine, Ser 1.60 (*)    Glucose, Bld 114 (*)    Hemoglobin 12.2 (*)    HCT 36.0 (*)    All other components within normal limits  URINE CULTURE  TROPONIN I (HIGH SENSITIVITY)    EKG EKG Interpretation  Date/Time:  Monday April 28 2020 22:31:56 EDT Ventricular Rate:  98 PR Interval:    QRS Duration: 105 QT Interval:  358 QTC Calculation: 458 R Axis:   -101 Text Interpretation: Sinus arrhythmia Abnormal R-wave progression, late transition Inferior infarct, old No significant change since last tracing Confirmed by Richardean Canal [phone]) on 04/28/2020 11:06:14 PM   Radiology DG Chest Port 1 View  Result  Date: 04/28/2020 CLINICAL DATA:  Weakness EXAM: PORTABLE CHEST 1 VIEW COMPARISON:  Radiograph 04/17/2020, CT 02/26/2009 FINDINGS: Some atelectatic changes towards the lung bases. No consolidation, features of edema, pneumothorax, or effusion. Pulmonary vascularity well distributed. The aorta is calcified. The remaining cardiomediastinal contours are unremarkable. Prior plate and screw fixation of the distal right clavicle. No acute osseous or soft tissue abnormality. Telemetry leads overlie the chest. IMPRESSION: Basilar atelectasis, otherwise no acute cardiopulmonary or traumatic findings in the chest. Electronically Signed   By: Kreg Shropshire M.D.   On: 04/28/2020 23:40    Procedures Procedures (including critical care time)  Medications Ordered in ED Medications  potassium chloride (KLOR-CON) packet 40 mEq (has no administration in time range)  sodium chloride 0.9 % bolus 1,000 mL (0 mLs Intravenous Stopped 04/29/20 0020)  LORazepam (ATIVAN) injection 1 mg (1 mg Intravenous Given 04/28/20 2342)    ED Course  I have reviewed the triage vital signs and the nursing notes.  Pertinent labs & imaging results that were available during my care of the patient were reviewed by me and considered in my medical decision making (see chart for details).    MDM Rules/Calculators/A&P                          Jacob Fleming is a 79 y.o. man with history of dementia, nephrolithiasis, HTN, HLD, and BPH who was BIB EMS from his residence at [facility], a memory care facility, for weakness.  - Patient is non-toxic appearing, pleasantly demented on exam. VSS. With minimal history, unclear whether patient had true near-syncope or syncope. Recently seen and treated for UTI. Will obtain basic labs (CBC, CMP, urinalysis with reflex culture), troponin,  orthostatics, give 1 L NS, and obtain CXR.  - Patient administered 1 mg lorazepam for agitation during I&O catheterization.  - CMP significant for hypokalemia to  3.1. Will replete with Klor-Con. Cr 1.60, which is at patient's baseline. Troponin not elevated. CBC significant for slight anemia to 12.2, however no leukocytosis. Urinalysis significant for moderate Hgb (expected due to agitation during I&O cath), however otherwise unremarkable. CXR unremarkable.   Given above work-up, suspect patient's episode of weakness due to near-syncope secondary to dehydration. Patient also found to have hypokalemia. Received potassium repletion in ED and discharged back to facility with 5-day course of potassium repletion.  Final Clinical Impression(s) / ED Diagnoses Final diagnoses:  Dehydration  Hypokalemia    Rx / DC Orders ED Discharge Orders         Ordered    potassium chloride (KLOR-CON) 20 MEQ packet  Daily     Discontinue  Reprint     04/29/20 0026           Alphonzo Severance, MD 04/29/20 0124    Charlynne Pander, MD 05/02/20 [phone]

## 2020-04-28 NOTE — ED Triage Notes (Signed)
Patient brought via [facility] from [facility]. EMS states they were called out because when patient got up from a sitting position he became weak and unable to stand on his legs. Also reported that patient " Went black for a moment".Facility personal did not state that patient had a syncopal episode. Pt alert & oriented x2, which is patients baseline. VSS, BG 121. Denies pain

## 2020-04-29 DIAGNOSIS — E860 Dehydration: Secondary | ICD-10-CM | POA: Diagnosis not present

## 2020-04-29 LAB — I-STAT CHEM 8, ED
BUN: 26 mg/dL — ABNORMAL HIGH (ref 8–23)
Calcium, Ion: 1.24 mmol/L (ref 1.15–1.40)
Chloride: 99 mmol/L (ref 98–111)
Creatinine, Ser: 1.6 mg/dL — ABNORMAL HIGH (ref 0.61–1.24)
Glucose, Bld: 114 mg/dL — ABNORMAL HIGH (ref 70–99)
HCT: 36 % — ABNORMAL LOW (ref 39.0–52.0)
Hemoglobin: 12.2 g/dL — ABNORMAL LOW (ref 13.0–17.0)
Potassium: 3 mmol/L — ABNORMAL LOW (ref 3.5–5.1)
Sodium: 143 mmol/L (ref 135–145)
TCO2: 30 mmol/L (ref 22–32)

## 2020-04-29 LAB — COMPREHENSIVE METABOLIC PANEL
ALT: 14 U/L (ref 0–44)
AST: 20 U/L (ref 15–41)
Albumin: 3.5 g/dL (ref 3.5–5.0)
Alkaline Phosphatase: 81 U/L (ref 38–126)
Anion gap: 12 (ref 5–15)
BUN: 27 mg/dL — ABNORMAL HIGH (ref 8–23)
CO2: 28 mmol/L (ref 22–32)
Calcium: 9.4 mg/dL (ref 8.9–10.3)
Chloride: 103 mmol/L (ref 98–111)
Creatinine, Ser: 1.59 mg/dL — ABNORMAL HIGH (ref 0.61–1.24)
GFR calc Af Amer: 47 mL/min — ABNORMAL LOW (ref >60)
GFR calc non Af Amer: 40 mL/min — ABNORMAL LOW (ref >60)
Glucose, Bld: 113 mg/dL — ABNORMAL HIGH (ref 70–99)
Potassium: 3.1 mmol/L — ABNORMAL LOW (ref 3.5–5.1)
Sodium: 143 mmol/L (ref 135–145)
Total Bilirubin: 0.8 mg/dL (ref 0.3–1.2)
Total Protein: 6.9 g/dL (ref 6.5–8.1)

## 2020-04-29 LAB — URINALYSIS, ROUTINE W REFLEX MICROSCOPIC
Bilirubin Urine: NEGATIVE
Glucose, UA: NEGATIVE mg/dL
Ketones, ur: NEGATIVE mg/dL
Nitrite: NEGATIVE
Protein, ur: 100 mg/dL — AB
RBC / HPF: >50 RBC/hpf — ABNORMAL HIGH (ref 0–5)
Specific Gravity, Urine: 1.013 (ref 1.005–1.030)
pH: 7 (ref 5.0–8.0)

## 2020-04-29 LAB — CBC WITH DIFFERENTIAL/PLATELET
Abs Immature Granulocytes: 0.02 10*3/uL (ref 0.00–0.07)
Basophils Absolute: 0.1 10*3/uL (ref 0.0–0.1)
Basophils Relative: 1 %
Eosinophils Absolute: 0.3 10*3/uL (ref 0.0–0.5)
Eosinophils Relative: 4 %
HCT: 35.9 % — ABNORMAL LOW (ref 39.0–52.0)
Hemoglobin: 11.9 g/dL — ABNORMAL LOW (ref 13.0–17.0)
Immature Granulocytes: 0 %
Lymphocytes Relative: 22 %
Lymphs Abs: 1.8 10*3/uL (ref 0.7–4.0)
MCH: 31.6 pg (ref 26.0–34.0)
MCHC: 33.1 g/dL (ref 30.0–36.0)
MCV: 95.5 fL (ref 80.0–100.0)
Monocytes Absolute: 0.9 10*3/uL (ref 0.1–1.0)
Monocytes Relative: 11 %
Neutro Abs: 4.9 10*3/uL (ref 1.7–7.7)
Neutrophils Relative %: 62 %
Platelets: 241 10*3/uL (ref 150–400)
RBC: 3.76 MIL/uL — ABNORMAL LOW (ref 4.22–5.81)
RDW: 12.7 % (ref 11.5–15.5)
WBC: 7.9 10*3/uL (ref 4.0–10.5)
nRBC: 0 % (ref 0.0–0.2)

## 2020-04-29 LAB — TROPONIN I (HIGH SENSITIVITY): Troponin I (High Sensitivity): 4 ng/L (ref <18)

## 2020-04-29 MED ORDER — POTASSIUM CHLORIDE 20 MEQ PO PACK
40.0000 meq | PACK | Freq: Once | ORAL | Status: DC
  Filled 2020-04-29: qty 2

## 2020-04-29 MED ORDER — POTASSIUM CHLORIDE CRYS ER 20 MEQ PO TBCR: 40.0000 meq | EXTENDED_RELEASE_TABLET | Freq: Once | ORAL | Status: DC

## 2020-04-29 MED ORDER — POTASSIUM CHLORIDE 20 MEQ PO PACK
20.0000 meq | PACK | Freq: Every day | ORAL | 0 refills | Status: AC
Start: 2020-04-29 — End: 2021-05-19

## 2020-04-29 NOTE — ED Notes (Signed)
Report called to Latoya at [facility]. [facility] called for transport.

## 2020-04-29 NOTE — Discharge Instructions (Addendum)
Mr. Garlitz was found to have low potassium levels. We have prescribed a 5-day regimen of potassium repletion. We recommend his potassium level be rechecked in 1 week.

## 2020-04-30 LAB — URINE CULTURE: Culture: NO GROWTH

## 2020-05-13 ENCOUNTER — Encounter: Payer: Medicare Other | Admitting: Internal Medicine

## 2020-05-13 ENCOUNTER — Other Ambulatory Visit: Payer: Self-pay

## 2020-05-16 ENCOUNTER — Emergency Department (HOSPITAL_COMMUNITY)
Admission: EM | Admit: 2020-05-16 | Discharge: 2020-05-16 | Disposition: A | Discharge status: Skilled Nursing Facility | Payer: Medicare Other | Attending: Emergency Medicine | Admitting: Emergency Medicine

## 2020-05-16 ENCOUNTER — Other Ambulatory Visit: Payer: Self-pay

## 2020-05-16 ENCOUNTER — Encounter (HOSPITAL_COMMUNITY): Payer: Self-pay

## 2020-05-16 ENCOUNTER — Emergency Department (HOSPITAL_COMMUNITY): Payer: Medicare Other

## 2020-05-16 DIAGNOSIS — Z79899 Other long term (current) drug therapy: Secondary | ICD-10-CM | POA: Diagnosis not present

## 2020-05-16 DIAGNOSIS — W1830XA Fall on same level, unspecified, initial encounter: Secondary | ICD-10-CM | POA: Insufficient documentation

## 2020-05-16 DIAGNOSIS — Y939 Activity, unspecified: Secondary | ICD-10-CM | POA: Insufficient documentation

## 2020-05-16 DIAGNOSIS — F0390 Unspecified dementia without behavioral disturbance: Secondary | ICD-10-CM | POA: Diagnosis not present

## 2020-05-16 DIAGNOSIS — S82891A Other fracture of right lower leg, initial encounter for closed fracture: Secondary | ICD-10-CM

## 2020-05-16 DIAGNOSIS — S0990XA Unspecified injury of head, initial encounter: Secondary | ICD-10-CM | POA: Insufficient documentation

## 2020-05-16 DIAGNOSIS — Y999 Unspecified external cause status: Secondary | ICD-10-CM | POA: Insufficient documentation

## 2020-05-16 DIAGNOSIS — Y92129 Unspecified place in nursing home as the place of occurrence of the external cause: Secondary | ICD-10-CM | POA: Insufficient documentation

## 2020-05-16 DIAGNOSIS — S99911A Unspecified injury of right ankle, initial encounter: Secondary | ICD-10-CM | POA: Diagnosis present

## 2020-05-16 DIAGNOSIS — I10 Essential (primary) hypertension: Secondary | ICD-10-CM | POA: Diagnosis not present

## 2020-05-16 DIAGNOSIS — W19XXXA Unspecified fall, initial encounter: Secondary | ICD-10-CM

## 2020-05-16 DIAGNOSIS — S8261XA Displaced fracture of lateral malleolus of right fibula, initial encounter for closed fracture: Secondary | ICD-10-CM | POA: Diagnosis not present

## 2020-05-16 DIAGNOSIS — Z7982 Long term (current) use of aspirin: Secondary | ICD-10-CM | POA: Insufficient documentation

## 2020-05-16 MED ORDER — WHEELCHAIR MISC: 0 refills | Status: AC

## 2020-05-16 NOTE — ED Triage Notes (Signed)
Per EMS, Pt is coming from [facility]. Called out today due a witnessed fall. Pt fell from a standing position on his left side, pt did hit his left hip, and possibly his head. Pt complaining of right ankle pain. Pt also complaining of abd pain but is has been known to state this in the past. HX of dementia, pt at baseline.

## 2020-05-16 NOTE — ED Notes (Signed)
This RN attempted to call report to [facility] to give report. No answer.

## 2020-05-16 NOTE — Discharge Instructions (Signed)
Tylenol or motrin as needed for pain.  Leave cam walker in place for now.  Recommend to use wheel chair for mobility until seen by orthopedics. Follow-up with Dr. Eulah Pont-- call his office today to schedule appointment. Return to the ED for new or worsening symptoms.

## 2020-05-16 NOTE — ED Notes (Signed)
ED attending currently in room with patient.

## 2020-05-16 NOTE — ED Provider Notes (Addendum)
[facility]-EMERGENCY DEPT Provider Note   CSN: [id number] Arrival date & time: 05/16/20  0230     History Chief Complaint  Patient presents with  . Fall    Jacob Fleming is a 79 y.o. male.  The history is provided by the patient and medical records.  Fall    LEVEL V CAVEAT:  DEMENTIA  79 y.o. M with hx of anxiety, dementia, HLP, HTN, presenting to the ED after a fall.  Patient had witnessed fall by staff members-- appeared to be a mechanical fall from standing position, fell onto left side, with head trauma.  No LOC.  Patient complained of right ankle pain.  He reported hip pain to EMS but denies this to me.  Patient has otherwise been well.  No intervention PTA.  Facility reported he is at his neurologic baseline.  Patient states he generally walks unassisted.  Past Medical History:  Diagnosis Date  . Anxiety   . BPH (benign prostatic hyperplasia)   . Dementia (HCC)   . History of kidney stones   . Hyperlipidemia   . Hypertension     Patient Active Problem List   Diagnosis Date Noted  . Dizziness 11/16/2017  . TIA (transient ischemic attack) 11/15/2017  . HYPERLIPIDEMIA 08/01/2009  . HYPERTENSION 08/01/2009    Past Surgical History:  Procedure Laterality Date  . APPENDECTOMY    . BACK SURGERY     L5-L6  . CYSTOSCOPY W/ RETROGRADES     pyelogram  . CYSTOSCOPY/RETROGRADE/URETEROSCOPY/STONE EXTRACTION WITH BASKET Left 05/09/2013   Procedure: CYSTOSCOPY/LEFT RETROGRADE/URETEROSCOPY/STONE EXTRACTION WITH BASKET;  Surgeon: Ky BarbanMohammad I Javaid, MD;  Location: [facility];  Service: Urology;  Laterality: Left;  . HOLMIUM LASER APPLICATION N/A 05/09/2013   Procedure: HOLMIUM LASER APPLICATION;  Surgeon: Ky BarbanMohammad I Javaid, MD;  Location: [facility];  Service: Urology;  Laterality: N/A;  . INGUINAL HERNIA REPAIR Right 03/07/2015   Procedure: HERNIA REPAIR INGUINAL ADULT WITH MESH;  Surgeon: Franky MachoMark Jenkins Md, MD;  Location: [facility];  Service: General;  Laterality:  Right;  . LITHOTRIPSY N/A 05/09/2013   Procedure: LITHOTRIPSY;  Surgeon: Ky BarbanMohammad I Javaid, MD;  Location: [facility];  Service: Urology;  Laterality: N/A;  . TONSILLECTOMY         Family History  Problem Relation Age of Onset  . Dementia Mother   . Other Father        perforated intestine  . Diabetes Brother   . Diabetes Sister   . Coronary artery disease Other   . Diabetes Other   . Hypertension Other     Social History   Tobacco Use  . Smoking status: Never Smoker  . Smokeless tobacco: Never Used  Vaping Use  . Vaping Use: Never used  Substance Use Topics  . Alcohol use: No  . Drug use: No    Home Medications Prior to Admission medications   Medication Sig Start Date End Date Taking? Authorizing Provider  aspirin EC 81 MG tablet Take 81 mg by mouth daily.    [provider]  cephALEXin (KEFLEX) 500 MG capsule Take 1 capsule (500 mg total) by mouth 4 (four) times daily. 04/17/20   Mesner, Barbara CowerJason, MD  chlorthalidone (HYGROTON) 25 MG tablet Take 25 mg by mouth daily.  06/14/16   [provider]  citalopram (CELEXA) 10 MG tablet Take 10 mg by mouth daily. 02/18/20   [provider]  finasteride (PROSCAR) 5 MG tablet Take 5 mg by mouth daily.    [provider]  LORazepam (ATIVAN) 0.5 MG tablet Take 0.5 mg by mouth daily.  03/28/20   [provider]  Phenazopyridine HCl 97.5 MG TABS Take 2 tablets by mouth 3 (three) times daily after meals.    [provider]  potassium chloride (KLOR-CON) 20 MEQ packet Take 20 mEq by mouth daily for 5 days. 04/29/20 05/04/20  Alphonzo Severance, MD  QUEtiapine (SEROQUEL XR) 200 MG 24 hr tablet Take 200 mg by mouth at bedtime.    [provider]  rosuvastatin (CRESTOR) 5 MG tablet Take 1 tablet (5 mg total) by mouth daily at 6 PM. 11/17/17   Philip Aspen, Limmie Patricia, MD  sertraline (ZOLOFT) 25 MG tablet Take 25 mg by mouth daily.    [provider]  Tamsulosin HCl (FLOMAX) 0.4 MG CAPS  Take 0.4 mg by mouth daily.      [provider]  traZODone (DESYREL) 100 MG tablet Take 100 mg by mouth at bedtime.    [provider]    Allergies    Memantine  Review of Systems   Review of Systems  Unable to perform ROS: Dementia    Physical Exam Updated Vital Signs BP 120/66 (BP Location: Right Arm)   Pulse 75   Temp 98 F (36.7 C) (Oral)   Resp 16   SpO2 98%   Physical Exam Vitals and nursing note reviewed.  Constitutional:      Appearance: He is well-developed.  HENT:     Head: Normocephalic and atraumatic.     Comments: No visible head trauma Eyes:     Conjunctiva/sclera: Conjunctivae normal.     Pupils: Pupils are equal, round, and reactive to light.  Neck:     Comments: c-collar in place Cardiovascular:     Rate and Rhythm: Normal rate and regular rhythm.     Heart sounds: Normal heart sounds.  Pulmonary:     Effort: Pulmonary effort is normal.     Breath sounds: Normal breath sounds.  Abdominal:     General: Bowel sounds are normal.     Palpations: Abdomen is soft.  Musculoskeletal:        General: Normal range of motion.     Comments: Pelvis stable, non-tender, no leg shortening or malrotation noted TTP along medial and lateral malleolus, mild swelling noted, DP pulse intact, moving toes normally, good distal sensation, feet warm and well perfused  Skin:    General: Skin is warm and dry.  Neurological:     Mental Status: He is alert.     Comments: Awake, alert, oriented to self and surrounding, able to give limited history about fall, moving extremities on command     ED Results / Procedures / Treatments   Labs (all labs ordered are listed, but only abnormal results are displayed) Labs Reviewed - No data to display  EKG None  Radiology DG Chest 1 View  Result Date: 05/16/2020 CLINICAL DATA:  Larey Seat, dementia, back pain EXAM: CHEST  1 VIEW COMPARISON:  04/28/2020 FINDINGS: Single frontal view of the chest demonstrates a  stable cardiac silhouette. Mild atherosclerosis within the aortic arch. No acute airspace disease, effusion, or pneumothorax. No acute bony abnormalities. IMPRESSION: 1. No acute intrathoracic process. Electronically Signed   By: Sharlet Salina M.D.   On: 05/16/2020 04:04   DG Pelvis 1-2 Views  Result Date: 05/16/2020 CLINICAL DATA:  Larey Seat, left hip pain EXAM: PELVIS - 1-2 VIEW COMPARISON:  04/04/2020 FINDINGS: Single frontal view of the pelvis demonstrates no acute  fractures. Alignment is anatomic. Joint spaces are well preserved. Large rounded calcifications overlying the lower central abdomen correspond to renal calculi within a horseshoe kidney. Calcifications measure 2.4 and 2.7 cm respectively. IMPRESSION: 1. No acute displaced fracture. 2. 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 Electronically Signed   By: Sharlet Salina M.D.   On: 05/16/2020 03:59   DG Ankle Complete Right  Result Date: 05/16/2020 CLINICAL DATA:  Larey Seat, right ankle pain EXAM: RIGHT ANKLE - COMPLETE 3+ VIEW COMPARISON:  None. FINDINGS: Frontal, oblique, and lateral views of the right ankle are obtained. There is a nondisplaced transverse fracture through the distal aspect of the lateral malleolus, best seen on the frontal and oblique projections. Ankle mortise is intact. Mild soft tissue swelling anterior and lateral aspect of the ankle. IMPRESSION: 1. Minimally displaced fracture through the distal margin of the lateral malleolus, with overlying soft tissue swelling. Electronically Signed   By: Sharlet Salina M.D.   On: 05/16/2020 03:56   CT Head Wo Contrast  Result Date: 05/16/2020 CLINICAL DATA:  Head trauma. Witnessed fall from a standing position. EXAM: CT HEAD WITHOUT CONTRAST CT CERVICAL SPINE WITHOUT CONTRAST TECHNIQUE: Multidetector CT imaging of the head and cervical spine was performed following the standard protocol without intravenous contrast. Multiplanar CT image reconstructions of the cervical spine  were also generated. COMPARISON:  Head CT 04/17/2020 FINDINGS: CT HEAD FINDINGS Brain: There is no evidence of acute infarct, intracranial hemorrhage, mass, midline shift, or extra-axial fluid collection. Moderate central predominant cerebral atrophy is again seen. Hypodensities in the cerebral white matter bilaterally are unchanged and nonspecific but compatible with moderate chronic small vessel ischemic disease. Vascular: Calcified atherosclerosis at the skull base. No hyperdense vessel. Skull: No fracture identified within limitations of motion artifact which is greatest through the skull base. Sinuses/Orbits: Visualized paranasal sinuses and mastoid air cells are clear. Unremarkable orbits. Other: None. CT CERVICAL SPINE FINDINGS Alignment: 2-3 mm anterolisthesis of C4 on C5, C5 on C6, and C7 on T1. Skull base and vertebrae: No acute fracture or suspicious osseous lesion. Mild median C1-2 arthropathy. Soft tissues and spinal canal: No prevertebral fluid or swelling. No visible canal hematoma. Disc levels: Focally advanced disc degeneration at C6-7 with severe disc space narrowing, degenerative endplate sclerosis, and spurring but no evidence of significant result in stenosis. Moderately advanced facet arthrosis on the left at C4-5 and bilaterally at C7-T1. Upper chest: Clear lung apices. Other: Mild carotid atherosclerosis. IMPRESSION: 1. No evidence of acute intracranial abnormality. 2. Moderate chronic small vessel ischemic disease. 3. No acute cervical spine fracture. Electronically Signed   By: Sebastian Ache M.D.   On: 05/16/2020 04:13   CT Cervical Spine Wo Contrast  Result Date: 05/16/2020 CLINICAL DATA:  Head trauma. Witnessed fall from a standing position. EXAM: CT HEAD WITHOUT CONTRAST CT CERVICAL SPINE WITHOUT CONTRAST TECHNIQUE: Multidetector CT imaging of the head and cervical spine was performed following the standard protocol without intravenous contrast. Multiplanar CT image reconstructions of  the cervical spine were also generated. COMPARISON:  Head CT 04/17/2020 FINDINGS: CT HEAD FINDINGS Brain: There is no evidence of acute infarct, intracranial hemorrhage, mass, midline shift, or extra-axial fluid collection. Moderate central predominant cerebral atrophy is again seen. Hypodensities in the cerebral white matter bilaterally are unchanged and nonspecific but compatible with moderate chronic small vessel ischemic disease. Vascular: Calcified atherosclerosis at the skull base. No hyperdense vessel. Skull: No fracture identified within limitations of motion artifact which is greatest through the skull base. Sinuses/Orbits:  Visualized paranasal sinuses and mastoid air cells are clear. Unremarkable orbits. Other: None. CT CERVICAL SPINE FINDINGS Alignment: 2-3 mm anterolisthesis of C4 on C5, C5 on C6, and C7 on T1. Skull base and vertebrae: No acute fracture or suspicious osseous lesion. Mild median C1-2 arthropathy. Soft tissues and spinal canal: No prevertebral fluid or swelling. No visible canal hematoma. Disc levels: Focally advanced disc degeneration at C6-7 with severe disc space narrowing, degenerative endplate sclerosis, and spurring but no evidence of significant result in stenosis. Moderately advanced facet arthrosis on the left at C4-5 and bilaterally at C7-T1. Upper chest: Clear lung apices. Other: Mild carotid atherosclerosis. IMPRESSION: 1. No evidence of acute intracranial abnormality. 2. Moderate chronic small vessel ischemic disease. 3. No acute cervical spine fracture. Electronically Signed   By: Sebastian Ache M.D.   On: 05/16/2020 04:13    Procedures Procedures (including critical care time)  Medications Ordered in ED Medications - No data to display  ED Course  I have reviewed the triage vital signs and the nursing notes.  Pertinent labs & imaging results that were available during my care of the patient were reviewed by me and considered in my medical decision making (see  chart for details).    MDM Rules/Calculators/A&P  79 year old male presenting to the ED after witnessed fall at his nursing facility.  Had a mechanical fall and hurt his right ankle.  There was head injury without loss of consciousness.  Patient is awake, alert, oriented to self and surroundings on exam.  He is at his baseline per staff facility.  He does have pain with palpation of the right ankle.  There was mention of left hip pain, however pelvis is stable and nontender on exam, no leg shortening or malrotation.  C-collar is in place, no visible signs of head trauma.  Given patient's age and dementia, will obtain screening x-rays along with head and neck CT.  X-ray with minimally displaced fracture through the distal lateral malleolus.  Imaging is otherwise negative.  C-collar was removed and he was able to range his neck without difficulty.  Patient placed in cam walker for fracture stabilization.  Unfortunately, given his dementia I do not feel he will be cognizant enough to use crutches safely so I have written order for wheelchair to use at his facility until cleared by orthopedics for weightbearing.  Given referral to on-call physician, facility to help schedule later today.  Return here for any new or acute changes.  Shared visit with attending physician, Dr. Nicanor Alcon, who agrees with assessment and plan of care.  Final Clinical Impression(s) / ED Diagnoses Final diagnoses:  Fall, initial encounter  Closed fracture of right ankle, initial encounter    Rx / DC Orders ED Discharge Orders         Ordered    Misc. Devices [facility]) MISC     Discontinue  Reprint     05/16/20 0549           Garlon Hatchet, PA-C 05/16/20 0553    Garlon Hatchet, PA-C 05/16/20 2355    Nicanor Alcon, April, MD 05/16/20 [phone]

## 2020-05-19 ENCOUNTER — Non-Acute Institutional Stay: Payer: Medicare Other | Admitting: Internal Medicine

## 2020-05-19 ENCOUNTER — Encounter: Payer: Self-pay | Admitting: Internal Medicine

## 2020-05-19 ENCOUNTER — Other Ambulatory Visit: Payer: Self-pay

## 2020-05-19 VITALS — Wt 152.1 lb

## 2020-05-19 DIAGNOSIS — Z7189 Other specified counseling: Secondary | ICD-10-CM

## 2020-05-19 DIAGNOSIS — Z515 Encounter for palliative care: Secondary | ICD-10-CM

## 2020-05-19 NOTE — Progress Notes (Addendum)
05/19/2020 [facility] Consult Note Telephone: [phone]  Fax: [phone]  PATIENT NAME: Jacob Fleming DOB: 1940/11/08 MRN: [id number]  [facility] place RM 35(March 2020/02/16)  PRIMARY CARE / REFFERING PROVIDER:   Angelica Pou NP  RESPONSIBLE PARTY:   Jacob Fleming, Jacob Fleming) (Daughter/HCPOA) [phone] ([place]).  [address] [place] [address] [place] (son lives in [name]  05/20/20 Addendum: Weights sent from facility: Current weight 152.1 lbs, which is a decreased of 8.9 lbs over the last 3 months.   ASSESSMENT / RECOMMENDATIONS:  1. Advance Care Planning: A. Directives: Daughter Jacob Fleming reports that when patient was "in his right mind" stated he wouldn't want resuscitation if he were to become demented. I discussed components of the MOST form with Daughter. Jacob Fleming would wish DNR/DNI. Scope of Medical Interventions limited to Comfort. IVFs/Antibiotics: determine at time of need. No tube feeding. I will complete MOST to reflect our conversation. I will mail the form to her, she will review and sign, then she will mail back to me to upload into [facility] EMR and place onto facility chart. I'll also complete the DNR form, upload into [facility] EMR and place into facility chart.  B. Goals of Care: Comfort. Enjoys roaming about facility and into courtyard.  2. Cognitive / Functional status, Symptom Management: Patient is conversant and on topic, but constantly confused with very poor short / long term memory. Could not remember that he had just returned from orthopedic appointment. Patient no longer recognizes his family members. He is oriented to self only. Family reports little spontaneous speech. He tends to wander into other patient's rooms and lay down to rest on their beds. Staff report patient used to have behavior management issues but now improved. Seems better with recent medication adjustment. Incontinent of bowel and bladder.    Needs assist with dressing and hygiene. He can feed himself. Daughter reports  weight of approximately 15 lbs. Roams constantly about facility without use of assistive devices. No skin breakdown.  Fall 3 days ago. ER evaluation possible fx R ankle but saw ortho today and cleared to weight bear without restrictions. Unable to cognate use of assistive devices.   3. Family / Community Supports: Spouse deceased  2011/02/16. Involved daughter Jacob Fleming; son lives in [place]. Resident [facility].  4. Follow up Palliative Care Visit: in 3-4 months and prn.  I spent 60 minutes providing this consultation from 9:30-10:30am. More than 50% of the time in this consultation was spent coordinating communication.   HISTORY OF PRESENT ILLNESS:  Jacob Fleming is a 79 y.o. male with h/o anxiety, dementia, HLP, HTN, TIA.  05/16/2020 ER after fall from standing position; head trauma and R ankle pain. Minially displaced fracture distal lateral malleolus. Wheelchair (d/t inability to cognate to use crutches) till cleared by ortho for weightbearing.   Palliative Care was asked to help address goals of care.   CODE STATUS: DNR  PPS: 50%  HOSPICE ELIGIBILITY/DIAGNOSIS: TBD  PAST MEDICAL HISTORY:  Past Medical History:  Diagnosis Date  . Anxiety   . BPH (benign prostatic hyperplasia)   . Dementia (HCC)   . History of kidney stones   . Hyperlipidemia   . Hypertension     SOCIAL HX:  Social History   Tobacco Use  . Smoking status: Never Smoker  . Smokeless tobacco: Never Used  Substance Use Topics  . Alcohol use: No    ALLERGIES:  Allergies  Allergen Reactions  . Memantine  Other reaction(s): Confusion (intolerance)     PERTINENT MEDICATIONS:  Outpatient Encounter Medications as of 05/19/2020  Medication Sig  . aspirin EC 81 MG tablet Take 81 mg by mouth daily.  . chlorthalidone (HYGROTON) 25 MG tablet Take 25 mg by mouth daily.   . Ensure (ENSURE) Take 237 mLs by mouth 2 (two) times daily between  meals.  . finasteride (PROSCAR) 5 MG tablet Take 5 mg by mouth daily.  Marland Kitchen LORazepam (ATIVAN) 0.5 MG tablet Take 0.5 mg by mouth daily.   . Misc. Devices [facility]) MISC 1 wheelchair for regular use  . Phenazopyridine HCl 97.5 MG TABS Take 2 tablets by mouth 3 (three) times daily after meals.  . potassium chloride (KLOR-CON) 20 MEQ packet Take 20 mEq by mouth daily for 5 days.  Marland Kitchen QUEtiapine (SEROQUEL XR) 200 MG 24 hr tablet Take 200 mg by mouth at bedtime.  . rosuvastatin (CRESTOR) 5 MG tablet Take 1 tablet (5 mg total) by mouth daily at 6 PM.  . sertraline (ZOLOFT) 50 MG tablet Take 50 mg by mouth daily.   . Tamsulosin HCl (FLOMAX) 0.4 MG CAPS Take 0.4 mg by mouth daily.    . traZODone (DESYREL) 100 MG tablet Take 100 mg by mouth at bedtime.  . [DISCONTINUED] cephALEXin (KEFLEX) 500 MG capsule Take 1 capsule (500 mg total) by mouth 4 (four) times daily.  . [DISCONTINUED] citalopram (CELEXA) 10 MG tablet Take 10 mg by mouth daily.   No facility-administered encounter medications on file as of 05/19/2020.    PHYSICAL EXAM:   General: NAD, frail appearing, thin, lying on the bed in another resident's room. Pleasantly engaging, maintaining eye contact. Conversant. Oriented to self only. Extremities: no edema, no joint deformities Skin: no rashes Neurological: Weakness but otherwise non-focal  Anselm Lis, NP [phone]

## 2020-07-21 ENCOUNTER — Other Ambulatory Visit: Payer: Self-pay

## 2020-07-21 ENCOUNTER — Non-Acute Institutional Stay: Payer: Medicare Other | Admitting: Nurse Practitioner

## 2020-07-21 DIAGNOSIS — F0390 Unspecified dementia without behavioral disturbance: Secondary | ICD-10-CM

## 2020-07-21 DIAGNOSIS — F0391 Unspecified dementia with behavioral disturbance: Secondary | ICD-10-CM

## 2020-07-21 NOTE — Progress Notes (Addendum)
[facility] Consult Note Telephone: [phone]  Fax: [phone]  PATIENT NAME: Jacob Fleming [address] [place] [address] [phone] (home)  DOB: 1941/10/19 MRN: [id number]  PRIMARY CARE PROVIDER:    Lemmie Evens, MD,  [place] [address] [phone]  REFERRING PROVIDER:  Monico Blitz, NP    RESPONSIBLE PARTY:   Extended Emergency Contact Information Primary Emergency Contact: Fleming,Jacob Address: [address]          [place], [place] [address] Johnnette Litter of [place] Phone: [phone] Mobile Phone: [phone] Relation: Daughter Secondary Emergency Contact: Jacob Fleming Mobile Phone: [phone] Relation: Son  I met face to face with patient in facility.  ASSESSMENT AND RECOMMENDATIONS:   1. Advance Care Planning/Goals of Care:  Goal of care: Per prior visit documentation, goal of care is comfort.  Directives: Patient's code status is DNR. Signed copy of DNR form and MOST form on chart in facility and on [facility] EMR. MOST form details includes: comfort measures, determine use or limitation of antibiotics, IV fluids for a defined trial period, no feeding tube.  Symptom Management: Patient with advance dementia (FAST 7a). Patient with ongoing weight loss related to poor appetite, on Remeron $RemoveBe'15mg'HIABVXFDL$  at bed time and Ensure nutritional supplement twice a day. Weight 136.7lbs down from 152lbs at last palliative care visit  2 months ago. Per staff, patient tends to wander within the facility and to other resident rooms. He is sometimes mentally unable to sit eat at times, sometimes refuses to eat. Behavior improved on current med regimen of Lorazepam 0.$RemoveBefore'5mg'NraLCqWRgmQxX$  BID as needed and Seroquel XR $RemoveBef'200mg'QiHgfrojGA$  daily. Recommend encouraging and offering patient snack between meals, providing finger food snacks and encouraging healthy meal choices from a variety of food groups. Palliative care will  continue to provide support to patient, family and the medical team.   3. Follow up Palliative Care Visit: Palliative care will continue to follow for goals of care clarification and symptom management. Return 4 weeks or prn.  4. Family /Caregiver/Community Supports: Patient is resident in a memory care Assisted living. Widowed in 2012. Daughter Jacob Fleming reported to be very involved in his care. Son lives in [place]. Will call daughter to discuss visit findings.  5. Cognitive / Functional decline: Patient alert and oriented to self, able to make his needs known. Per staff, patient unable to recognize family. Patient ambulates independently without assistive device, no report of recent falls. Patient dependent on staff for assistance with ADLs, but able to feed self. Requires constant redirection.  I spent 35 minutes providing this consultation. More than 50% of the time in this consultation was spent coordinating communication.   HISTORY OF PRESENT ILLNESS:  Jacob Fleming is a 79 y.o. year old male with multiple medical problems including anxiety, dementia, HLP, HTN, TIA. Palliative Care was asked to follow this patient by consultation request of Jacob Blitz, NP to help address advance care planning and goals of care. This is a follow up visit.  CODE STATUS: DNR  PPS: 50%  HOSPICE ELIGIBILITY/DIAGNOSIS: TBD  PAST MEDICAL HISTORY:  Past Medical History:  Diagnosis Date  . Anxiety   . BPH (benign prostatic hyperplasia)   . Dementia ([place])   . History of kidney stones   . Hyperlipidemia   . Hypertension     SOCIAL HX:  Social History   Tobacco Use  . Smoking status: Never Smoker  . Smokeless tobacco: Never Used  Substance Use  Topics  . Alcohol use: No   FAMILY HX:  Family History  Problem Relation Age of Onset  . Dementia Mother   . Other Father        perforated intestine  . Diabetes Brother   . Diabetes Sister   . Coronary artery disease Other   . Diabetes Other   .  Hypertension Other     ALLERGIES:  Allergies  Allergen Reactions  . Memantine     Other reaction(s): Confusion (intolerance)     PERTINENT MEDICATIONS:  Outpatient Encounter Medications as of 81/27/2021  Medication Sig  . aspirin EC 81 MG tablet Take 81 mg by mouth daily.  . chlorthalidone (HYGROTON) 25 MG tablet Take 25 mg by mouth daily.   . Ensure (ENSURE) Take 237 mLs by mouth 2 (two) times daily between meals.  . finasteride (PROSCAR) 5 MG tablet Take 5 mg by mouth daily.  Marland Kitchen LORazepam (ATIVAN) 0.5 MG tablet Take 0.5 mg by mouth daily.   . Misc. Devices [facility]) [place] 1 wheelchair for regular use  . Phenazopyridine HCl 97.5 MG TABS Take 2 tablets by mouth 3 (three) times daily after meals.  . potassium chloride (KLOR-CON) 20 MEQ packet Take 20 mEq by mouth daily for 5 days.  Marland Kitchen QUEtiapine (SEROQUEL XR) 200 MG 24 hr tablet Take 200 mg by mouth at bedtime.  . rosuvastatin (CRESTOR) 5 MG tablet Take 1 tablet (5 mg total) by mouth daily at 6 PM.  . sertraline (ZOLOFT) 50 MG tablet Take 50 mg by mouth daily.   . Tamsulosin HCl (FLOMAX) 0.4 MG CAPS Take 0.4 mg by mouth daily.    . traZODone (DESYREL) 100 MG tablet Take 100 mg by mouth at bedtime.   No facility-administered encounter medications on file as of 07/21/2020.    PHYSICAL EXAM / ROS:  Addendum 07/22/2020: Current weight 136.7lbs down from 1152.1lbs at last palliative care visit 2 months ago. Unable to reach family to review goals of care. Current and past weights: awaiting current weight, last weight 152.1lbs,  General: NAD, frail appearing, thin. Walking in hall way, asking for directions Cardiovascular: no chest pain reported, no edema noted Pulmonary: no cough, no increased SOB, room air Abdomen: appetite poor, no report of constipation, incontinent of bowel GU: no report of dysuria, incontinent of urine MSK:  no joint and ROM abnormalities, ambulatory Skin: no rashes or wounds reported or noted on exposed  skin Neurological:  nonfocal  Jari Favre, DNP, AGPCNP-BC

## 2020-09-01 ENCOUNTER — Non-Acute Institutional Stay: Payer: Medicare Other | Admitting: Nurse Practitioner

## 2020-09-01 DIAGNOSIS — F0281 Dementia in other diseases classified elsewhere with behavioral disturbance: Secondary | ICD-10-CM

## 2020-09-01 DIAGNOSIS — G309 Alzheimer's disease, unspecified: Secondary | ICD-10-CM

## 2020-09-01 DIAGNOSIS — Z515 Encounter for palliative care: Secondary | ICD-10-CM

## 2020-09-01 NOTE — Progress Notes (Signed)
[facility] Consult Note Telephone: [phone]  Fax: [phone]  PATIENT NAME: Jacob Fleming 79 [address] [place] [address] [phone] (home)  DOB: 1941/05/08 MRN: [id number]  PRIMARY CARE PROVIDER:    Lemmie Evens, MD,  [place] [address] [phone]  REFERRING PROVIDER:   Monico Blitz, NP   RESPONSIBLE PARTY:   Extended Emergency Contact Information Primary Emergency Contact: Lape,Virgina Address: [address]          [place], [place] [address] Johnnette Litter of [place] Phone: [phone] Mobile Phone: [phone] Relation: Daughter Secondary Emergency Contact: goldl,byron Mobile Phone: [phone] Relation: Son  I met face to face with patient in facility.   ASSESSMENT AND RECOMMENDATIONS:   1. Advance Care Planning: Goal of care: Goal of care is comfort. Directives: Patient's code status is DNR. Signed copy of DNR and MOST forms on chart in facility and on [facility] EMR. MOST form details includes: comfort measures, determine use or limitation of antibiotics, IV fluids for a defined trial period, no feeding tube.  2. Symptom Management: Patient with advance dementia (FAST 7a). Patient with ongoing weight loss related to poor oral intake, on Remeron $RemoveBe'15mg'ImyaOkzXU$  at bed time and Ensure nutritional supplement twice a day. Patient had 6 lbs weight loss since last visit two months ago. Patient is known to wander within the facility most of the day, staff however report  some recent improvement in behavior. Patient noted today sitting down in the living area in the facility. No evidence of acute illness/distress noted on exam. Recommend continuing current regimen, encouraging calorie dense snacks between meals. Discussion on disease trajectory of dementia with staff, as it is progressive and terminal disease and likely to eventually lead to dysphagia, worsening weight loss and immobility.  Provided general support and encouragement. Palliative care will continue to provide support to patient, family and the medical team.   3. Follow up Palliative Care Visit: Palliative care will continue to follow for goals of care clarification and symptom management. Return in about 6-8 weeks or prn.  4. Family /Caregiver/Community Supports: Patient is a resident in a memory care Assisted Living facility, widowed in 2012. Daughter Donia Guiles reported to be very involved in his care. Son lives in [place].   5. Cognitive / Functional decline: Patient alert and oriented to self, able to make his needs known. Per staff, patient unable to recognize family. Patient ambulates independently without assistive device. Patient dependent on staff for assistance with ADLs, but able to feed self. Requires constant redirection. Staff report occasional inappropriate bowel and bladder excretion in public areas, has to be closely watched.  I spent 48 minutes providing this consultation, time includes time spent with patient, chart review, provider coordination, and documentation. More than 50% of the time in this consultation was spent coordinating communication.   HISTORY OF PRESENT ILLNESS:  Jacob Fleming is a 79 y.o. year old male with multiple medical problems including anxiety, dementia (FAST 7a), HLP, HTN, TIA. Palliative Care was asked to follow this patient by consultation request of Monico Blitz, NP to help address advance care planning and goals of care. This is a follow up visit from 07/21/2020.  CODE STATUS: DNR  PPS: 40%  HOSPICE ELIGIBILITY/DIAGNOSIS: TBD  PHYSICAL EXAM / ROS:   Current and past weights: 131.9lbs down from 136.7lbs two months ago General: frail appearing, thin, sitting in living area in NAD Cardiovascular: no chest pain reported, no edema noted Pulmonary:  no cough, no increased SOB, room air Abdomen: appetite poor, no report of constipation, incontinent of bowel GU: no report of  dysuria, incontinent of urine MSK:  no joint and ROM abnormalities, ambulatory Skin: no rashes or wounds reported or noted on exposed skin Neurological:  weakness, otherwise non-focal  PAST MEDICAL HISTORY:  Past Medical History:  Diagnosis Date  . Anxiety   . BPH (benign prostatic hyperplasia)   . Dementia ([place])   . History of kidney stones   . Hyperlipidemia   . Hypertension     SOCIAL HX:  Social History   Tobacco Use  . Smoking status: Never Smoker  . Smokeless tobacco: Never Used  Substance Use Topics  . Alcohol use: No   FAMILY HX:  Family History  Problem Relation Age of Onset  . Dementia Mother   . Other Father        perforated intestine  . Diabetes Brother   . Diabetes Sister   . Coronary artery disease Other   . Diabetes Other   . Hypertension Other    ALLERGIES:  Allergies  Allergen Reactions  . Memantine     Other reaction(s): Confusion (intolerance)     PERTINENT MEDICATIONS:  Outpatient Encounter Medications as of 09/01/2020  Medication Sig  . aspirin EC 81 MG tablet Take 81 mg by mouth daily.  . chlorthalidone (HYGROTON) 25 MG tablet Take 25 mg by mouth daily.   . Ensure (ENSURE) Take 237 mLs by mouth 2 (two) times daily between meals.  . finasteride (PROSCAR) 5 MG tablet Take 5 mg by mouth daily.  Marland Kitchen LORazepam (ATIVAN) 0.5 MG tablet Take 0.5 mg by mouth daily.   . Misc. Devices [facility]) [place] 1 wheelchair for regular use  . Phenazopyridine HCl 97.5 MG TABS Take 2 tablets by mouth 3 (three) times daily after meals.  . potassium chloride (KLOR-CON) 20 MEQ packet Take 20 mEq by mouth daily for 5 days.  Marland Kitchen QUEtiapine (SEROQUEL XR) 200 MG 24 hr tablet Take 200 mg by mouth at bedtime.  . rosuvastatin (CRESTOR) 5 MG tablet Take 1 tablet (5 mg total) by mouth daily at 6 PM.  . sertraline (ZOLOFT) 50 MG tablet Take 50 mg by mouth daily.   . Tamsulosin HCl (FLOMAX) 0.4 MG CAPS Take 0.4 mg by mouth daily.    . traZODone (DESYREL) 100 MG tablet Take 100  mg by mouth at bedtime.   No facility-administered encounter medications on file as of 09/01/2020.     Jari Favre, DNP, AGPCNP-BC

## 2020-09-02 ENCOUNTER — Other Ambulatory Visit: Payer: Self-pay

## 2020-11-25 DEATH — deceased

## 2022-02-14 IMAGING — CT CT HEAD W/O CM
3 series · 16 of 47 positions shown, 19 images · non-contrast
Comparison: None.

CLINICAL DATA: Found down.

EXAM:
CT HEAD WITHOUT CONTRAST
TECHNIQUE: Contiguous axial images were obtained from the base of the skull
through the vertex without intravenous contrast.

[Series 3: head wo · axial · 0.47mm/px · z∈[-174,-44]mm · 10 of 32 slices shown, 13 images]
[im 3/32  brain]
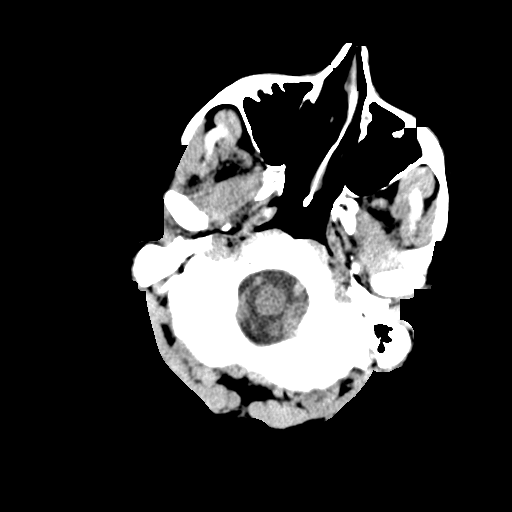
[im 3/32  bone]
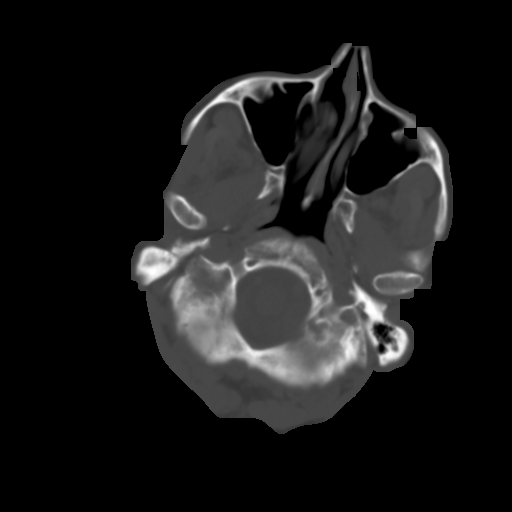
[im 6/32  brain]
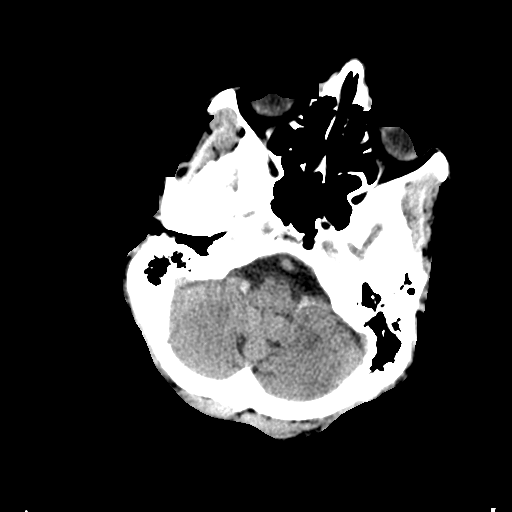
[im 9/32  brain]
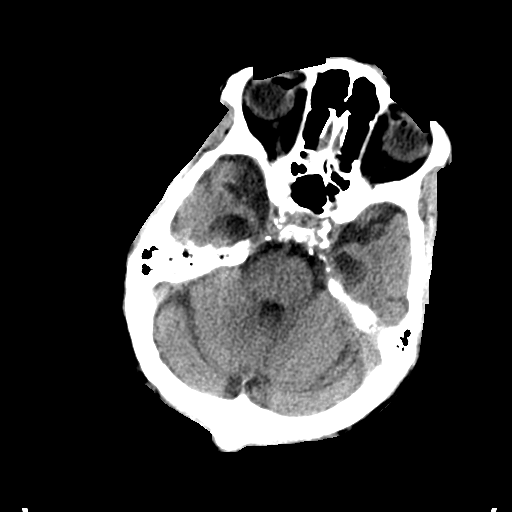
[im 11/32  brain]
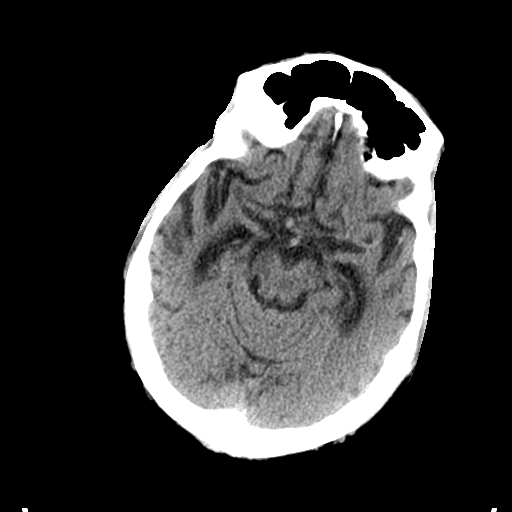
[im 14/32  brain]
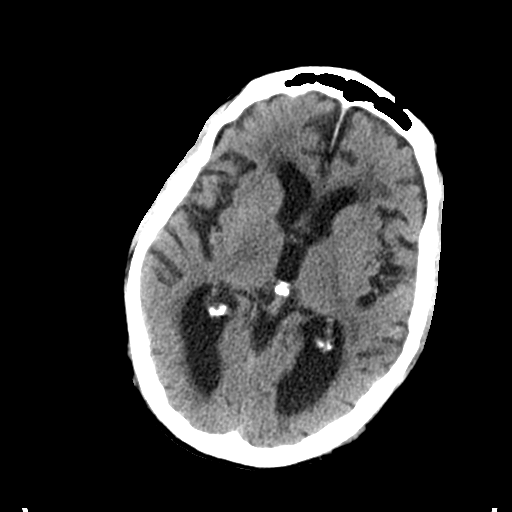
[im 14/32  bone]
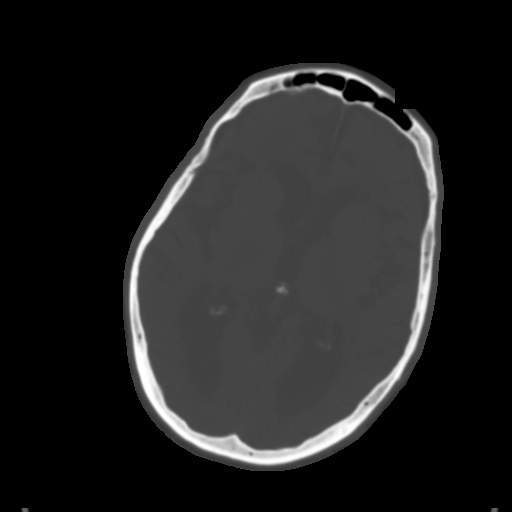
[im 18/32  brain]
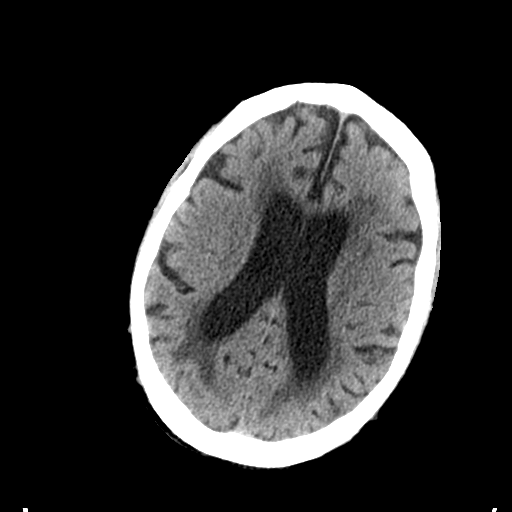
[im 21/32  brain]
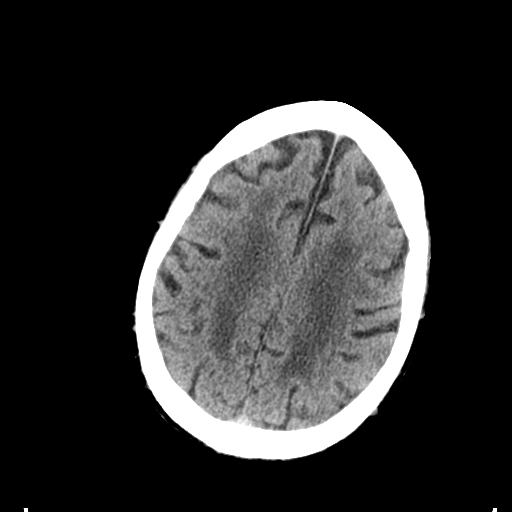
[im 24/32  brain]
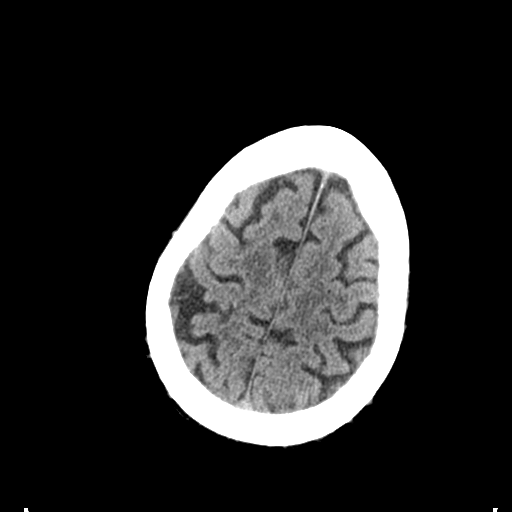
[im 26/32  brain]
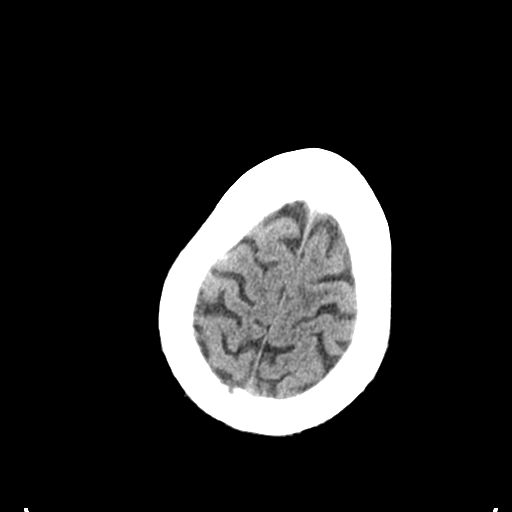
[im 26/32  bone]
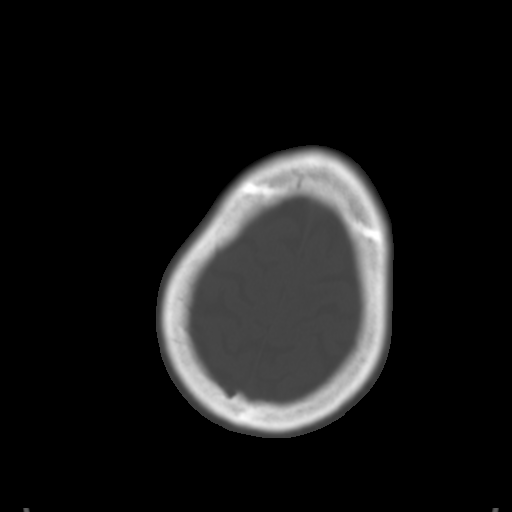
[im 29/32  brain]
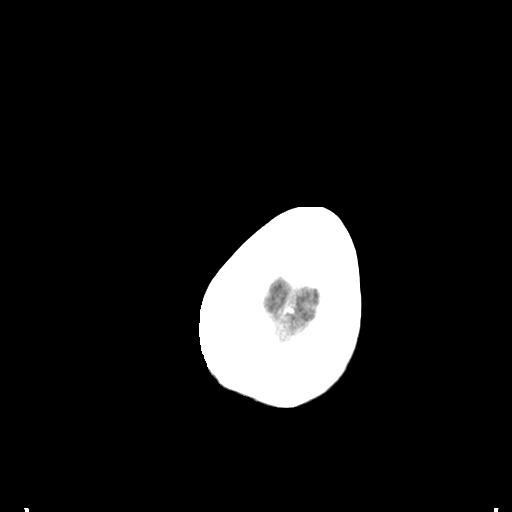

[Series 5: coronal soft tissue · coronal · 0.33mm/px · 3 of 71 slices shown]
[im 24/71  brain]
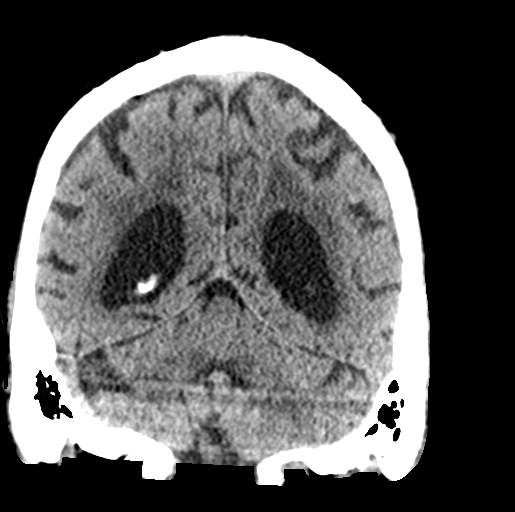
[im 32/71  brain]
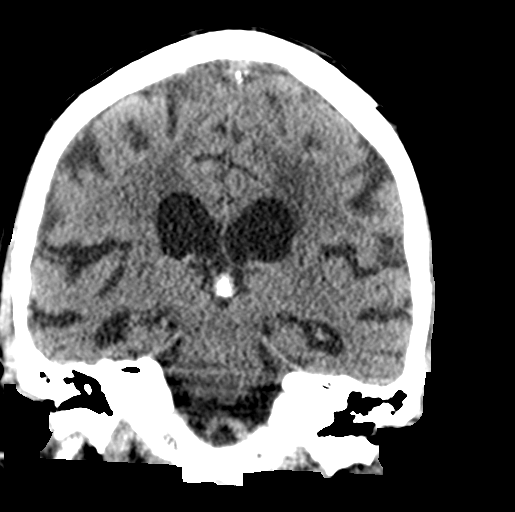
[im 39/71  brain]
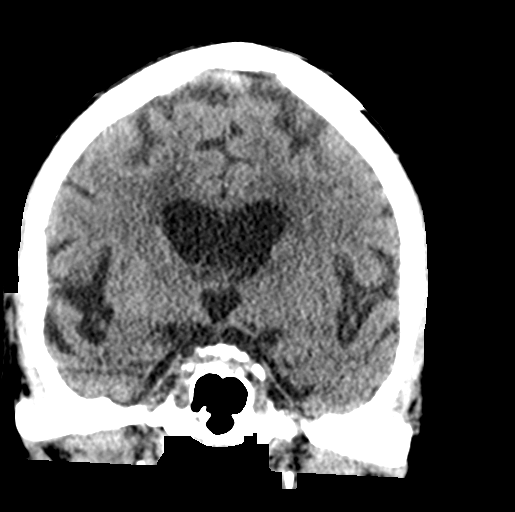

[Series 6: sagittal soft tissue · sagittal · 0.33mm/px · 3 of 58 slices shown]
[im 20/58  brain]
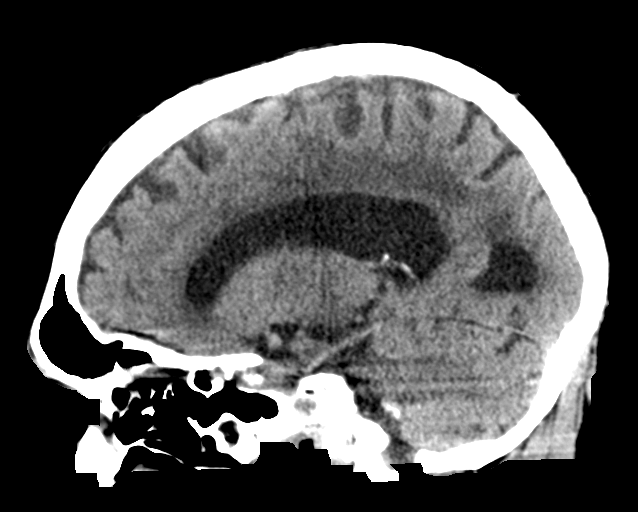
[im 29/58  brain]
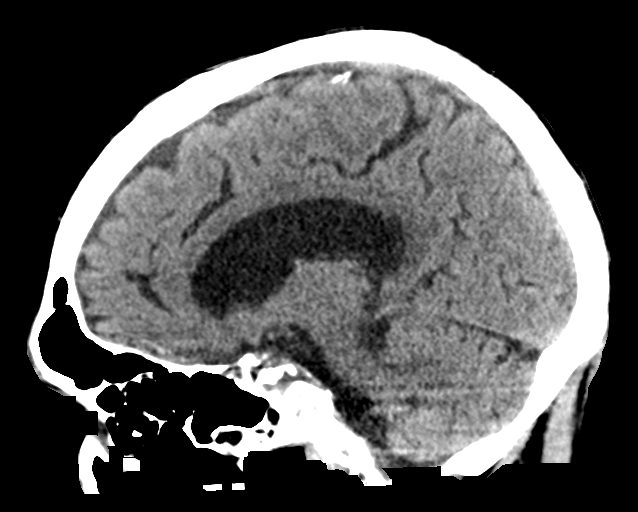
[im 39/58  brain]
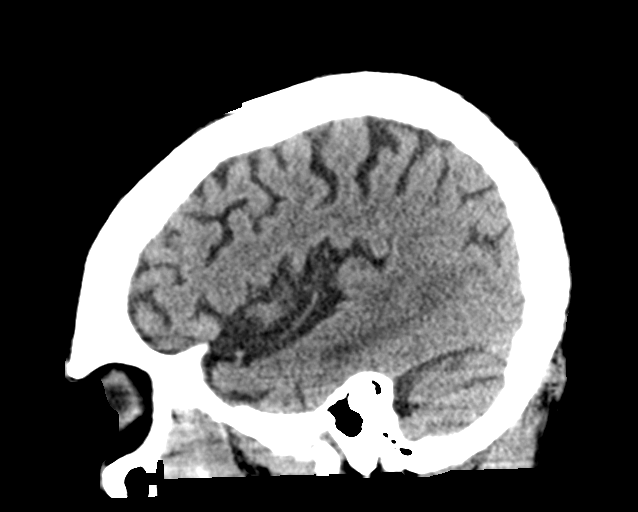

[16 of 47 positions shown; findings below may reference images not displayed]

FINDINGS: Brain: There is no mass, hemorrhage or extra-axial collection. There
is generalized atrophy without lobar predilection. Hypodensity of
the white matter is most commonly associated with chronic
microvascular disease.

Vascular: Atherosclerotic calcification of the internal carotid
arteries at the skull base. No abnormal hyperdensity of the major
intracranial arteries or dural venous sinuses.

Skull: The visualized skull base, calvarium and extracranial soft
tissues are normal.

Sinuses/Orbits: No fluid levels or advanced mucosal thickening of
the visualized paranasal sinuses. No mastoid or middle ear effusion.
The orbits are normal.
IMPRESSION: Generalized atrophy and chronic microvascular ischemia without acute
intracranial abnormality.
